# Patient Record
Sex: Female | Born: 1985 | Hispanic: No | Marital: Married | Smoking: Never smoker
Health system: Southern US, Community
[De-identification: ages and names within clinical notes are randomized; demographics above are authoritative.]

## PROBLEM LIST (undated history)

## (undated) ENCOUNTER — Inpatient Hospital Stay (HOSPITAL_COMMUNITY): Payer: Self-pay

## (undated) DIAGNOSIS — D649 Anemia, unspecified: Secondary | ICD-10-CM

## (undated) DIAGNOSIS — O219 Vomiting of pregnancy, unspecified: Secondary | ICD-10-CM

## (undated) HISTORY — PX: BURN TREATMENT: SHX158

---
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
## 2014-10-21 NOTE — L&D Delivery Note (Signed)
Delivery Note At 4:45 PM a viable and healthy female was delivered via Vaginal, Spontaneous Delivery (Presentation: Right Occiput Anterior).  APGAR: 8, 9; weight -pending   Placenta status: Intact, Spontaneous Pathology.  Cord: 3 vessels with the following complications: None.  Cord pH:n/a  Anesthesia: Epidural  Episiotomy: None Lacerations: 2nd degree Suture Repair: 3.0 vicryl rapide Est. Blood Loss (mL):  400 cc  Uterus explored manually due to large clot after initial 3rd stage bleeding, a piece of membrane removed.   Mom to postpartum.  Baby to Couplet care / Skin to Skin.  Maternal low grade fever pre-delivery 100.2, placenta, uterus warm, pt appears flushed, baby was warm at delivery, Plan Cefoxitin 2 gm one dose.   Aris Even R 08/12/2015, 5:28 PM

## 2015-01-11 ENCOUNTER — Encounter (HOSPITAL_COMMUNITY): Payer: Self-pay | Admitting: *Deleted

## 2015-01-11 ENCOUNTER — Inpatient Hospital Stay (HOSPITAL_COMMUNITY)
Admission: AD | Admit: 2015-01-11 | Discharge: 2015-01-11 | Disposition: A | Discharge status: Home / Self Care | Payer: 59 | Source: Ambulatory Visit | Attending: Obstetrics & Gynecology | Admitting: Obstetrics & Gynecology

## 2015-01-11 DIAGNOSIS — O99611 Diseases of the digestive system complicating pregnancy, first trimester: Secondary | ICD-10-CM | POA: Diagnosis not present

## 2015-01-11 DIAGNOSIS — O211 Hyperemesis gravidarum with metabolic disturbance: Secondary | ICD-10-CM | POA: Diagnosis not present

## 2015-01-11 DIAGNOSIS — E860 Dehydration: Secondary | ICD-10-CM

## 2015-01-11 DIAGNOSIS — Z3A01 Less than 8 weeks gestation of pregnancy: Secondary | ICD-10-CM | POA: Diagnosis not present

## 2015-01-11 DIAGNOSIS — R112 Nausea with vomiting, unspecified: Secondary | ICD-10-CM | POA: Diagnosis present

## 2015-01-11 DIAGNOSIS — K219 Gastro-esophageal reflux disease without esophagitis: Secondary | ICD-10-CM | POA: Diagnosis not present

## 2015-01-11 DIAGNOSIS — O210 Mild hyperemesis gravidarum: Secondary | ICD-10-CM | POA: Diagnosis present

## 2015-01-11 LAB — URINALYSIS, ROUTINE W REFLEX MICROSCOPIC
GLUCOSE, UA: NEGATIVE mg/dL
Ketones, ur: >80 mg/dL — AB
Leukocytes, UA: NEGATIVE
Nitrite: NEGATIVE
PH: 6 (ref 5.0–8.0)
Protein, ur: NEGATIVE mg/dL
Urobilinogen, UA: 0.2 mg/dL (ref 0.0–1.0)

## 2015-01-11 LAB — URINE MICROSCOPIC-ADD ON

## 2015-01-11 MED ORDER — ONDANSETRON HCL 4 MG/2ML IJ SOLN
4.0000 mg | Freq: Once | INTRAMUSCULAR | Status: AC
  Administered 2015-01-11: 4 mg via INTRAVENOUS
  Filled 2015-01-11: qty 2

## 2015-01-11 MED ORDER — PROMETHAZINE HCL 25 MG/ML IJ SOLN: 12.5000 mg | Freq: Four times a day (QID) | INTRAMUSCULAR | Status: DC | PRN

## 2015-01-11 MED ORDER — FAMOTIDINE IN NACL 20-0.9 MG/50ML-% IV SOLN
20.0000 mg | Freq: Once | INTRAVENOUS | Status: AC
  Administered 2015-01-11: 20 mg via INTRAVENOUS
  Filled 2015-01-11: qty 50

## 2015-01-11 MED ORDER — DEXTROSE 5 % IN LACTATED RINGERS IV BOLUS
2000.0000 mL | Freq: Once | INTRAVENOUS | Status: AC
  Administered 2015-01-11: 2000 mL via INTRAVENOUS

## 2015-01-11 NOTE — MAU Provider Note (Signed)
  History     CSN: [id number]  Arrival date and time: 01/11/15 1452   First Provider Initiated Contact with Patient 01/11/15 2024      Chief Complaint  Patient presents with  . Nausea  . Emesis   HPI Comments: G2P0010 @[redacted]w[redacted]d  sent from office of HEG. Reports multiple episodes of vomiting yesterday. Took Zofran ODT this am with some relief, no vomiting today but reports nausea, poor appetite, and burning in throat. Tolerating small sips of fluids, hasn't tried solids. CMP in office today WNL, UA with lrg ketones.     Past Medical History: unremarkable Past Surgical History: unremarkable  History  Substance Use Topics  . Smoking status: Not on file  . Smokeless tobacco: Not on file  . Alcohol Use: Not on file    Allergies: No Known Allergies  Prescriptions prior to admission  Medication Sig Dispense Refill Last Dose  . Doxylamine-Pyridoxine 10-10 MG TBEC Take 2 tablets by mouth at bedtime.   Past Week at Unknown time  . metoCLOPramide (REGLAN) 10 MG tablet Take 10 mg by mouth daily as needed for nausea.   Past Week at Unknown time  . ondansetron (ZOFRAN-ODT) 8 MG disintegrating tablet Take 8 mg by mouth every 8 (eight) hours as needed for nausea or vomiting.   01/11/2015 at Unknown time  . Prenatal Vit-Fe Fumarate-FA (PRENATAL MULTIVITAMIN) TABS tablet Take 1 tablet by mouth daily at 12 noon.   Past Week at Unknown time    Review of Systems  Constitutional: Negative.   HENT: Negative.   Eyes: Negative.   Respiratory: Negative.   Cardiovascular: Negative.   Gastrointestinal: Positive for heartburn, nausea and constipation.  Genitourinary: Negative.   Musculoskeletal: Negative.   Skin: Negative.   Neurological: Negative.   Endo/Heme/Allergies: Negative.   Psychiatric/Behavioral: Negative.    Physical Exam   Blood pressure 103/61, pulse 75, temperature 98.4 F (36.9 C), temperature source Oral, resp. rate 16, height 5\' 3"  (1.6 m), weight 53.978 kg (119 lb).  Physical  Exam  Constitutional: She is oriented to person, place, and time. She appears well-developed and well-nourished.  HENT:  Head: Normocephalic and atraumatic.  Eyes: Pupils are equal, round, and reactive to light.  Neck: Normal range of motion. Neck supple.  Cardiovascular: Normal rate and regular rhythm.   Respiratory: Effort normal and breath sounds normal.  GI: Soft. Bowel sounds are normal. There is no tenderness.  Genitourinary:  deferred  Musculoskeletal: Normal range of motion.  Neurological: She is alert and oriented to person, place, and time.  Skin: Skin is warm and dry.  Psychiatric: She has a normal mood and affect.    MAU Course  Procedures  D5LR IVF bolus x2 L Zofran IV Pepcid IV Po challenge-tolerated crackers and gingerale No episodes of vomiting since arrival Reluctant to trying po challenge  Assessment and Plan  [redacted] weeks gestation HEG Dehydration GERD  Discharge home Zofran 4mg  ODT q8 hrs prn (has Rx) Diclegis-full regimen (has Rx) Zantac 75 mg po bid HEG diet Maintain hydration-need to take sips q15 min while awake Follow-up in office in 1 week  Dajour Pierpoint, N 01/11/2015, 8:32 PM

## 2015-01-11 NOTE — Progress Notes (Signed)
Attempted IV X 2 unsuccessful. Blood return noted, would not thread. CRNA requested.

## 2015-01-11 NOTE — Discharge Instructions (Signed)
Eating Plan for Hyperemesis Gravidarum  Severe cases of hyperemesis gravidarum can lead to dehydration and malnutrition. The hyperemesis eating plan is one way to lessen the symptoms of nausea and vomiting. It is often used with prescribed medicines to control your symptoms.   WHAT CAN I DO TO RELIEVE MY SYMPTOMS?  Listen to your body. Everyone is different and has different preferences. Find what works best for you. Some of the following things may help:  · Eat and drink slowly.  · Eat 5-6 small meals daily instead of 3 large meals.    · Eat crackers before you get out of bed in the morning.    · Starchy foods are usually well tolerated (such as cereal, toast, bread, potatoes, pasta, rice, and pretzels).    · Ginger may help with nausea. Add ¼ tsp ground ginger to hot tea or choose ginger tea.    · Try drinking 100% fruit juice or an electrolyte drink.  · Continue to take your prenatal vitamins as directed by your health care provider. If you are having trouble taking your prenatal vitamins, talk with your health care provider about different options.  · Include at least 1 serving of protein with your meals and snacks (such as meats or poultry, beans, nuts, eggs, or yogurt). Try eating a protein-rich snack before bed (such as cheese and crackers or a half turkey or peanut butter sandwich).  WHAT THINGS SHOULD I AVOID TO REDUCE MY SYMPTOMS?  The following things may help reduce your symptoms:  · Avoid foods with strong smells. Try eating meals in well-ventilated areas that are free of odors.  · Avoid drinking water or other beverages with meals. Try not to drink anything less than 30 minutes before and after meals.  · Avoid drinking more than 1 cup of fluid at a time.  · Avoid fried or high-fat foods, such as butter and cream sauces.  · Avoid spicy foods.  · Avoid skipping meals the best you can. Nausea can be more intense on an empty stomach. If you cannot tolerate food at that time, do not force it. Try sucking on  ice chips or other frozen items and make up the calories later.  · Avoid lying down within 2 hours after eating.  Document Released: 08/04/2007 Document Revised: 10/12/2013 Document Reviewed: 08/11/2013  [employer]® Patient Information ©2015 [employer]. This information is not intended to replace advice given to you by your health care provider. Make sure you discuss any questions you have with your health care provider.

## 2015-01-11 NOTE — Progress Notes (Signed)
Fabian NovemberM. Bhambri, [name] given update on delay of IV start, patient has not vomited since arrival.

## 2015-01-11 NOTE — MAU Note (Signed)
C/o N &V  For past week; sent from OB to get fluids;

## 2015-02-10 LAB — OB RESULTS CONSOLE HIV ANTIBODY (ROUTINE TESTING): HIV: NONREACTIVE

## 2015-02-10 LAB — OB RESULTS CONSOLE RPR: RPR: NONREACTIVE

## 2015-02-10 LAB — OB RESULTS CONSOLE RUBELLA ANTIBODY, IGM: RUBELLA: IMMUNE

## 2015-02-10 LAB — OB RESULTS CONSOLE HEPATITIS B SURFACE ANTIGEN: HEP B S AG: NEGATIVE

## 2015-02-17 LAB — OB RESULTS CONSOLE GC/CHLAMYDIA
Chlamydia: NEGATIVE
Gonorrhea: NEGATIVE

## 2015-08-04 LAB — OB RESULTS CONSOLE GBS: GBS: NEGATIVE

## 2015-08-12 ENCOUNTER — Inpatient Hospital Stay (HOSPITAL_COMMUNITY)
Admission: AD | Admit: 2015-08-12 | Discharge: 2015-08-14 | DRG: 775 | Disposition: A | Discharge status: Home / Self Care | Payer: 59 | Source: Ambulatory Visit | Attending: Obstetrics & Gynecology | Admitting: Obstetrics & Gynecology

## 2015-08-12 ENCOUNTER — Inpatient Hospital Stay (HOSPITAL_COMMUNITY): Payer: 59 | Admitting: Anesthesiology

## 2015-08-12 ENCOUNTER — Inpatient Hospital Stay (HOSPITAL_COMMUNITY): Payer: 59

## 2015-08-12 ENCOUNTER — Encounter (HOSPITAL_COMMUNITY): Payer: Self-pay

## 2015-08-12 DIAGNOSIS — Z3A37 37 weeks gestation of pregnancy: Secondary | ICD-10-CM

## 2015-08-12 DIAGNOSIS — O99019 Anemia complicating pregnancy, unspecified trimester: Secondary | ICD-10-CM

## 2015-08-12 DIAGNOSIS — Z8249 Family history of ischemic heart disease and other diseases of the circulatory system: Secondary | ICD-10-CM

## 2015-08-12 DIAGNOSIS — D62 Acute posthemorrhagic anemia: Secondary | ICD-10-CM | POA: Diagnosis not present

## 2015-08-12 DIAGNOSIS — Z833 Family history of diabetes mellitus: Secondary | ICD-10-CM

## 2015-08-12 DIAGNOSIS — O26893 Other specified pregnancy related conditions, third trimester: Secondary | ICD-10-CM | POA: Diagnosis present

## 2015-08-12 DIAGNOSIS — O9081 Anemia of the puerperium: Secondary | ICD-10-CM | POA: Diagnosis not present

## 2015-08-12 DIAGNOSIS — O9962 Diseases of the digestive system complicating childbirth: Secondary | ICD-10-CM | POA: Diagnosis present

## 2015-08-12 DIAGNOSIS — K219 Gastro-esophageal reflux disease without esophagitis: Secondary | ICD-10-CM | POA: Diagnosis present

## 2015-08-12 DIAGNOSIS — Z3689 Encounter for other specified antenatal screening: Secondary | ICD-10-CM

## 2015-08-12 DIAGNOSIS — O411290 Chorioamnionitis, unspecified trimester, not applicable or unspecified: Secondary | ICD-10-CM | POA: Diagnosis present

## 2015-08-12 DIAGNOSIS — D509 Iron deficiency anemia, unspecified: Secondary | ICD-10-CM | POA: Diagnosis present

## 2015-08-12 HISTORY — DX: Anemia, unspecified: D64.9

## 2015-08-12 LAB — CBC
HCT: 34.5 % — ABNORMAL LOW (ref 36.0–46.0)
Hemoglobin: 11.5 g/dL — ABNORMAL LOW (ref 12.0–15.0)
MCH: 29.9 pg (ref 26.0–34.0)
MCHC: 33.3 g/dL (ref 30.0–36.0)
MCV: 89.6 fL (ref 78.0–100.0)
PLATELETS: 221 10*3/uL (ref 150–400)
RBC: 3.85 MIL/uL — AB (ref 3.87–5.11)
RDW: 21 % — ABNORMAL HIGH (ref 11.5–15.5)
WBC: 11 10*3/uL — ABNORMAL HIGH (ref 4.0–10.5)

## 2015-08-12 LAB — POCT FERN TEST: POCT Fern Test: POSITIVE

## 2015-08-12 LAB — RPR: RPR: NONREACTIVE

## 2015-08-12 LAB — ABO/RH: ABO/RH(D): O POS

## 2015-08-12 LAB — TYPE AND SCREEN
ABO/RH(D): O POS
Antibody Screen: NEGATIVE

## 2015-08-12 IMAGING — US US MFM OB LIMITED
1 series · 15 of 26 positions shown · non-contrast
Comparison: none

[Series 1: us mfm ob limited · 26 acquisitions, 15 frames shown]
[im 1/26]
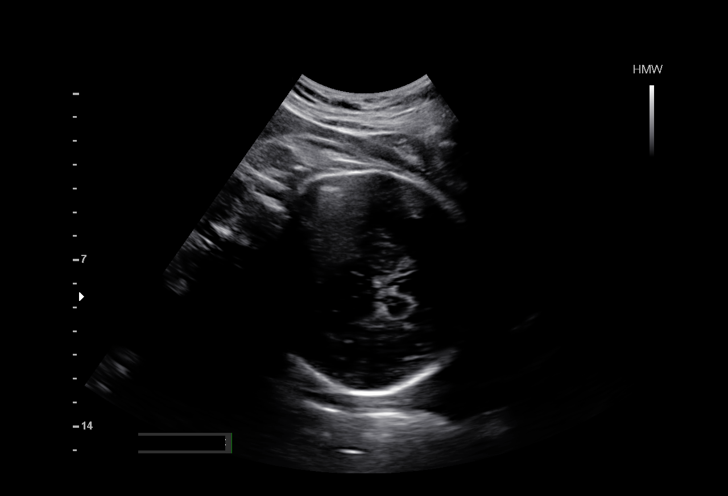
[im 3/26]
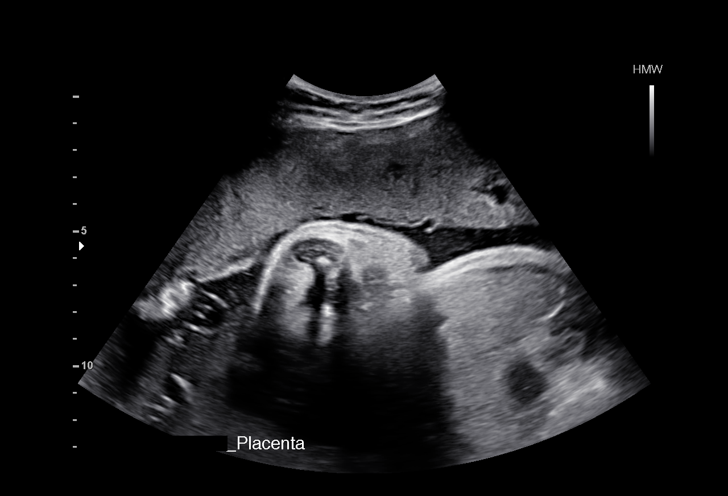
[im 5/26]
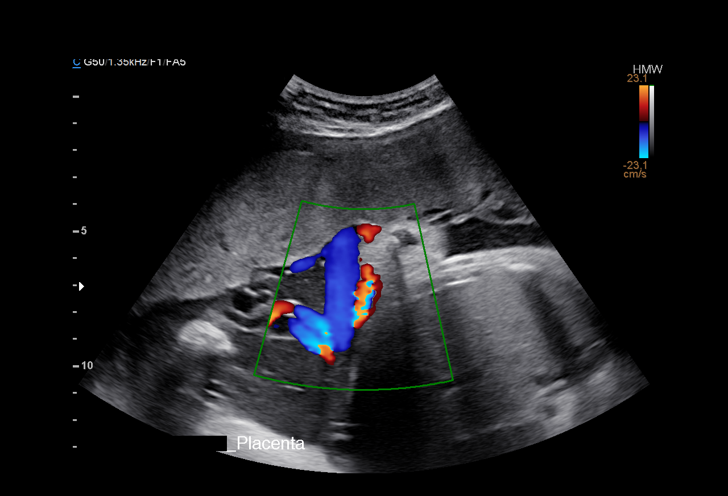
[im 7/26]
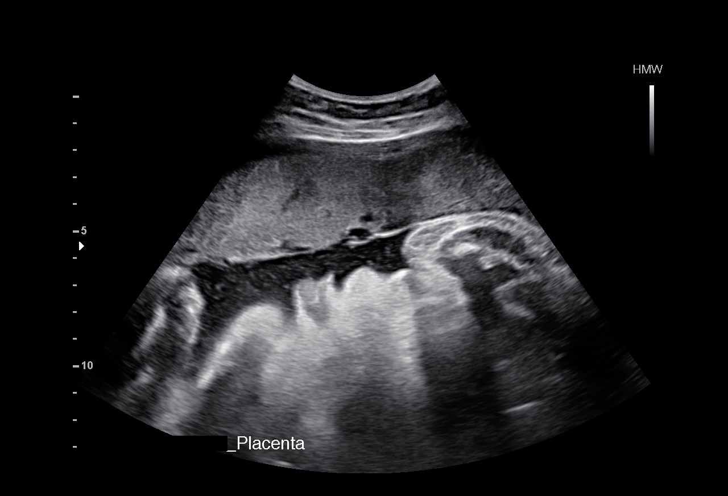
[im 8/26]
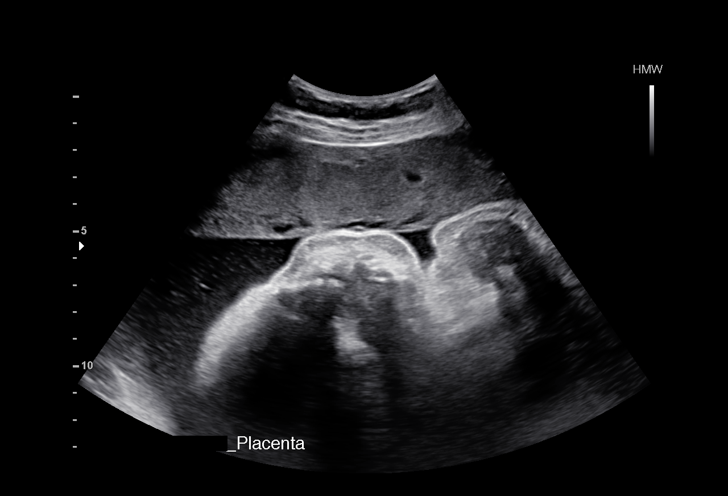
[im 10/26]
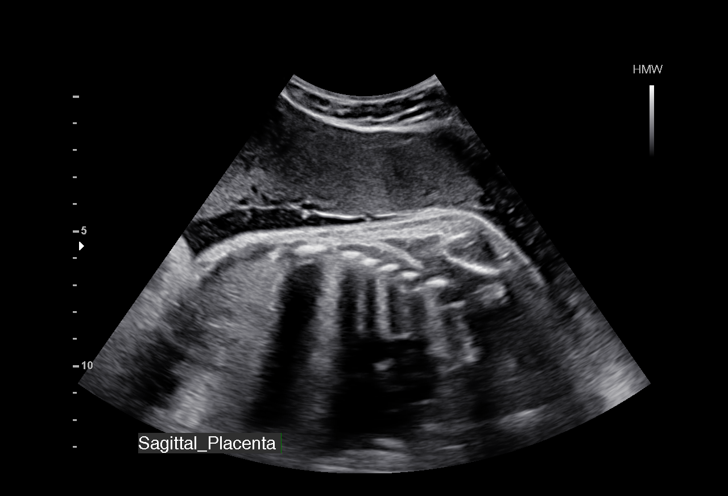
[im 12/26]
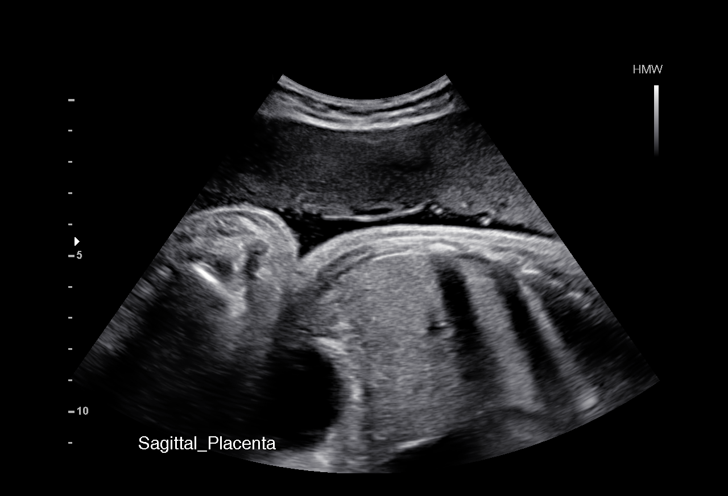
[im 14/26]
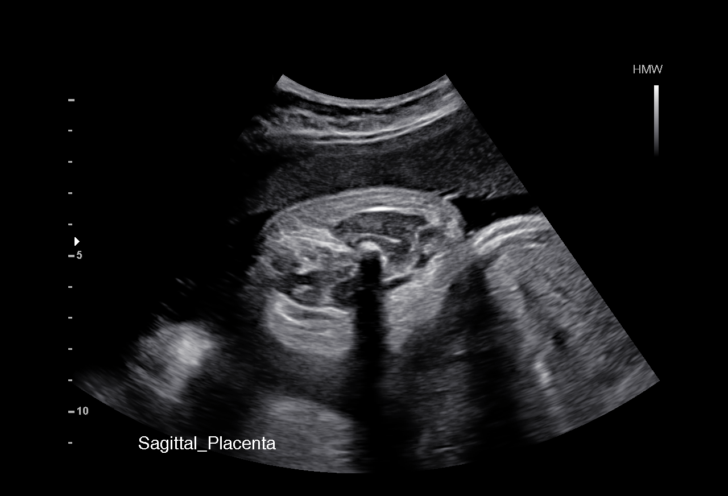
[im 15/26]
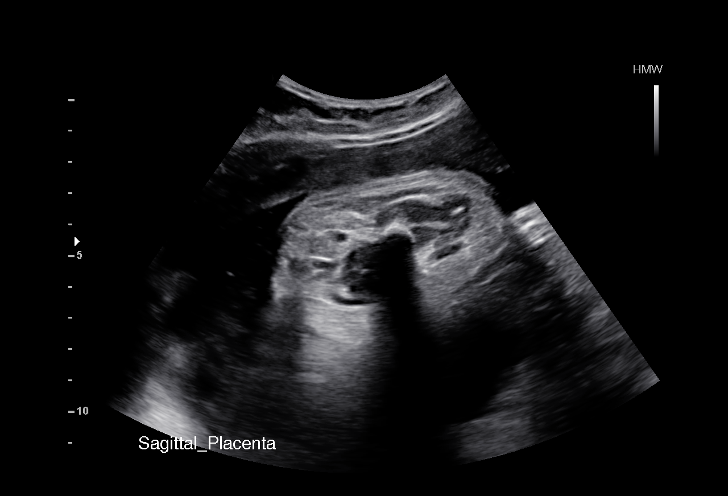
[im 17/26]
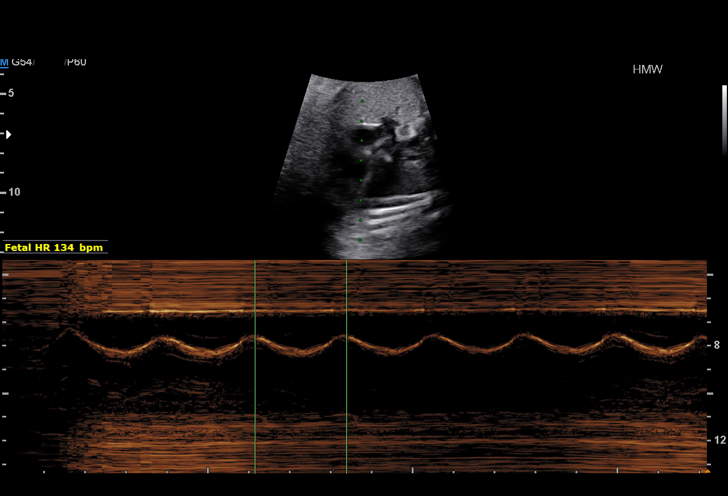
[im 19/26]
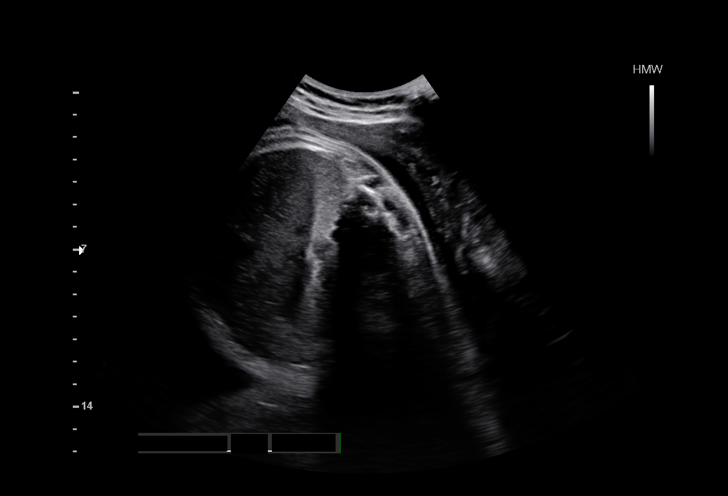
[im 20/26]
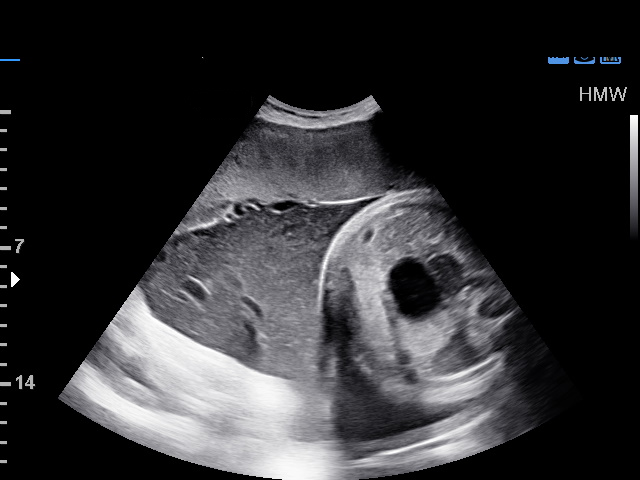
[im 22/26]
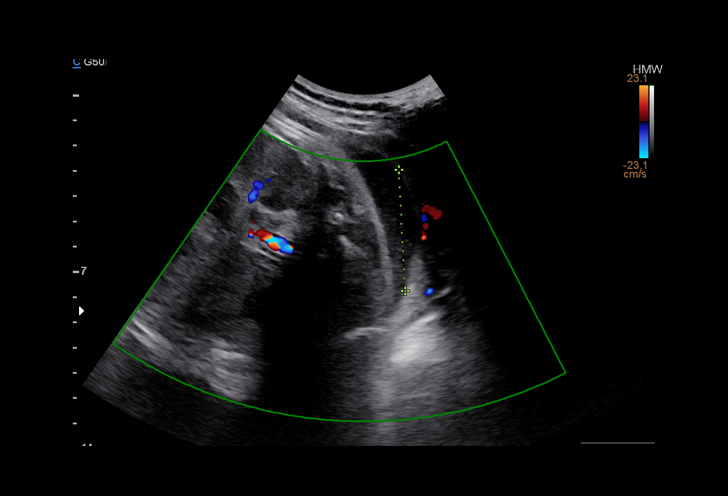
[im 24/26]
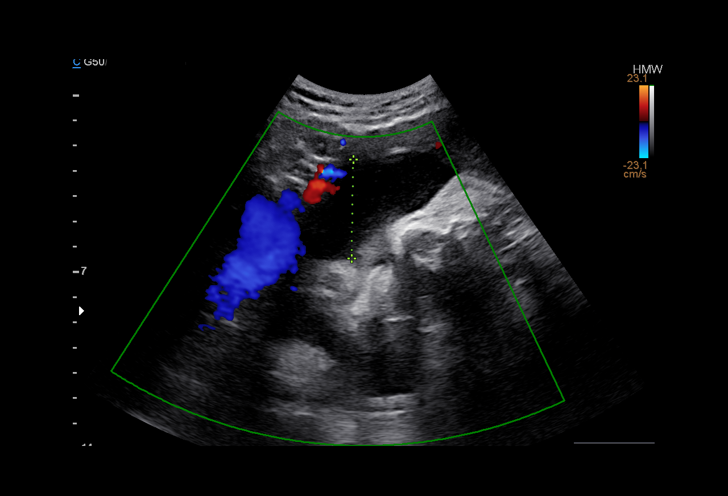
[im 26/26]
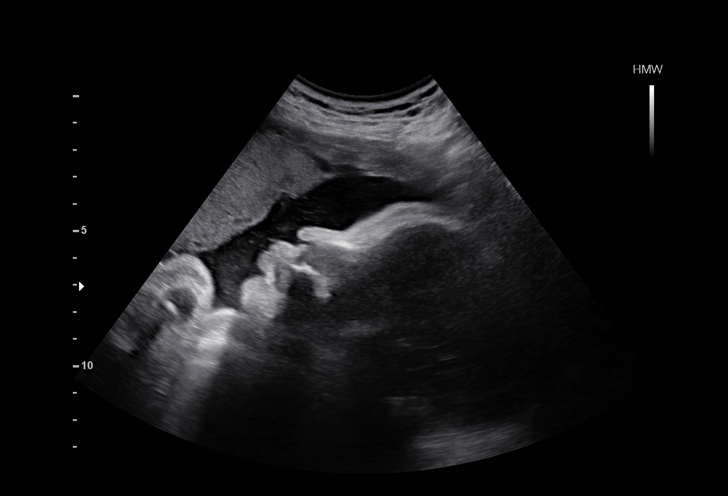

[15 of 26 positions shown; findings below may reference images not displayed]

OBSTETRICS REPORT
(Signed Final [DATE] [DATE])

Service(s) Provided

US MFM OB LIMITED                                     76815.01
Indications

37 weeks gestation of pregnancy
Premature rupture of membranes - leaking fluid        [DT]
Determine fetal presentation using ultrasound         Z36

Fetal Evaluation

Num Of Fetuses:    1
Fetal Heart Rate:  134                          bpm
Cardiac Activity:  Observed
Presentation:      Cephalic
Placenta:          Anterior, above cervical os
P. Cord            Visualized, central
Insertion:

Amniotic Fluid
AFI FV:      Subjectively within normal limits
AFI Sum:     13.12   cm       48  %Tile     Larg Pckt:     4.8  cm
RUQ:   4.41    cm   RLQ:    3.91   cm    LUQ:   4.8     cm   LLQ:    0      cm
Gestational Age

LMP:           37w 4d        Date:  [DATE]                 EDD:   [DATE]
Best:          37w 4d     Det. By:  LMP  ([DATE])          EDD:   [DATE]
Cervix Uterus Adnexa

Cervix:       Not visualized (advanced GA >[DT])
Uterus:       No abnormality visualized.
Cul De Sac:   No free fluid seen.
Left Ovary:    Not visualized.
Right Ovary:   Not visualized.

Adnexa:     No abnormality visualized.
Impression

Single IUP at 37w 4d
Limited ultrasound performed to determine fetal presentation
Cephalic presentation
Anterior placenta witout previa
Normal amniotic fluid volume (AFI 13 cm)
Recommendations

Follow up ultrasounds as clinically indicated

## 2015-08-12 MED ORDER — ZOLPIDEM TARTRATE 5 MG PO TABS: 5.0000 mg | ORAL_TABLET | Freq: Every evening | ORAL | Status: DC | PRN

## 2015-08-12 MED ORDER — LIDOCAINE HCL (PF) 1 % IJ SOLN
INTRAMUSCULAR | Status: DC | PRN
  Administered 2015-08-12 (×2): 5 mL

## 2015-08-12 MED ORDER — OXYTOCIN BOLUS FROM INFUSION
500.0000 mL | INTRAVENOUS | Status: DC
  Administered 2015-08-12: 500 mL via INTRAVENOUS

## 2015-08-12 MED ORDER — BENZOCAINE-MENTHOL 20-0.5 % EX AERO
1.0000 "application " | INHALATION_SPRAY | CUTANEOUS | Status: DC | PRN
  Administered 2015-08-13 – 2015-08-14 (×2): 1 via TOPICAL
  Filled 2015-08-12 (×2): qty 56

## 2015-08-12 MED ORDER — OXYTOCIN 40 UNITS IN LACTATED RINGERS INFUSION - SIMPLE MED
1.0000 m[IU]/min | INTRAVENOUS | Status: DC
  Administered 2015-08-12: 1 m[IU]/min via INTRAVENOUS
  Filled 2015-08-12: qty 1000

## 2015-08-12 MED ORDER — FENTANYL 2.5 MCG/ML BUPIVACAINE 1/10 % EPIDURAL INFUSION (WH - ANES)
14.0000 mL/h | INTRAMUSCULAR | Status: DC | PRN
  Administered 2015-08-12 (×2): 14 mL/h via EPIDURAL
  Filled 2015-08-12 (×2): qty 125

## 2015-08-12 MED ORDER — FLEET ENEMA 7-19 GM/118ML RE ENEM: 1.0000 | ENEMA | RECTAL | Status: DC | PRN

## 2015-08-12 MED ORDER — IBUPROFEN 600 MG PO TABS
600.0000 mg | ORAL_TABLET | Freq: Four times a day (QID) | ORAL | Status: DC
  Administered 2015-08-13 – 2015-08-14 (×7): 600 mg via ORAL
  Filled 2015-08-12 (×7): qty 1

## 2015-08-12 MED ORDER — LACTATED RINGERS IV SOLN
INTRAVENOUS | Status: DC
  Administered 2015-08-12 (×2): via INTRAVENOUS

## 2015-08-12 MED ORDER — OXYTOCIN 40 UNITS IN LACTATED RINGERS INFUSION - SIMPLE MED
62.5000 mL/h | INTRAVENOUS | Status: DC
  Administered 2015-08-12: 62.5 mL/h via INTRAVENOUS

## 2015-08-12 MED ORDER — TERBUTALINE SULFATE 1 MG/ML IJ SOLN
0.2500 mg | Freq: Once | INTRAMUSCULAR | Status: DC | PRN
  Filled 2015-08-12: qty 1

## 2015-08-12 MED ORDER — LACTATED RINGERS IV SOLN
500.0000 mL | INTRAVENOUS | Status: DC | PRN
  Administered 2015-08-12: 250 mL via INTRAVENOUS
  Administered 2015-08-12: 500 mL via INTRAVENOUS
  Administered 2015-08-12 (×2): 250 mL via INTRAVENOUS
  Administered 2015-08-12: 500 mL via INTRAVENOUS

## 2015-08-12 MED ORDER — DIPHENHYDRAMINE HCL 25 MG PO CAPS: 25.0000 mg | ORAL_CAPSULE | Freq: Four times a day (QID) | ORAL | Status: DC | PRN

## 2015-08-12 MED ORDER — DIBUCAINE 1 % RE OINT
1.0000 "application " | TOPICAL_OINTMENT | RECTAL | Status: DC | PRN
  Administered 2015-08-13: 1 via RECTAL
  Filled 2015-08-12: qty 28

## 2015-08-12 MED ORDER — ONDANSETRON HCL 4 MG/2ML IJ SOLN: 4.0000 mg | INTRAMUSCULAR | Status: DC | PRN

## 2015-08-12 MED ORDER — WITCH HAZEL-GLYCERIN EX PADS
1.0000 "application " | MEDICATED_PAD | CUTANEOUS | Status: DC | PRN
  Administered 2015-08-13: 1 via TOPICAL

## 2015-08-12 MED ORDER — ACETAMINOPHEN 325 MG PO TABS: 650.0000 mg | ORAL_TABLET | ORAL | Status: DC | PRN

## 2015-08-12 MED ORDER — ACETAMINOPHEN 325 MG PO TABS
650.0000 mg | ORAL_TABLET | ORAL | Status: DC | PRN
  Administered 2015-08-12: 650 mg via ORAL
  Filled 2015-08-12: qty 2

## 2015-08-12 MED ORDER — DEXTROSE 5 % IV SOLN
2.0000 g | Freq: Once | INTRAVENOUS | Status: AC
  Administered 2015-08-12: 2 g via INTRAVENOUS
  Filled 2015-08-12: qty 2

## 2015-08-12 MED ORDER — ONDANSETRON HCL 4 MG PO TABS
4.0000 mg | ORAL_TABLET | ORAL | Status: DC | PRN
  Administered 2015-08-12: 4 mg via ORAL
  Filled 2015-08-12: qty 1

## 2015-08-12 MED ORDER — DIPHENHYDRAMINE HCL 50 MG/ML IJ SOLN: 12.5000 mg | INTRAMUSCULAR | Status: DC | PRN

## 2015-08-12 MED ORDER — CITRIC ACID-SODIUM CITRATE 334-500 MG/5ML PO SOLN: 30.0000 mL | ORAL | Status: DC | PRN

## 2015-08-12 MED ORDER — SIMETHICONE 80 MG PO CHEW
80.0000 mg | CHEWABLE_TABLET | ORAL | Status: DC | PRN
Start: 2015-08-12 — End: 2015-08-14

## 2015-08-12 MED ORDER — OXYCODONE-ACETAMINOPHEN 5-325 MG PO TABS
1.0000 | ORAL_TABLET | ORAL | Status: DC | PRN
  Administered 2015-08-13 – 2015-08-14 (×3): 1 via ORAL
  Filled 2015-08-12 (×3): qty 1

## 2015-08-12 MED ORDER — PHENYLEPHRINE 40 MCG/ML (10ML) SYRINGE FOR IV PUSH (FOR BLOOD PRESSURE SUPPORT)
80.0000 ug | PREFILLED_SYRINGE | INTRAVENOUS | Status: DC | PRN
  Administered 2015-08-12 (×2): 80 ug via INTRAVENOUS
  Filled 2015-08-12: qty 20
  Filled 2015-08-12: qty 2

## 2015-08-12 MED ORDER — TETANUS-DIPHTH-ACELL PERTUSSIS 5-2.5-18.5 LF-MCG/0.5 IM SUSP: 0.5000 mL | Freq: Once | INTRAMUSCULAR | Status: DC

## 2015-08-12 MED ORDER — EPHEDRINE 5 MG/ML INJ
10.0000 mg | INTRAVENOUS | Status: DC | PRN
  Filled 2015-08-12: qty 2

## 2015-08-12 MED ORDER — PRENATAL MULTIVITAMIN CH
1.0000 | ORAL_TABLET | Freq: Every day | ORAL | Status: DC
  Administered 2015-08-13: 1 via ORAL
  Filled 2015-08-12: qty 1

## 2015-08-12 MED ORDER — ONDANSETRON HCL 4 MG/2ML IJ SOLN: 4.0000 mg | Freq: Four times a day (QID) | INTRAMUSCULAR | Status: DC | PRN

## 2015-08-12 MED ORDER — LIDOCAINE HCL (PF) 1 % IJ SOLN
30.0000 mL | INTRAMUSCULAR | Status: AC | PRN
  Administered 2015-08-12: 30 mL via SUBCUTANEOUS
  Filled 2015-08-12: qty 30

## 2015-08-12 MED ORDER — OXYCODONE-ACETAMINOPHEN 5-325 MG PO TABS: 2.0000 | ORAL_TABLET | ORAL | Status: DC | PRN

## 2015-08-12 MED ORDER — SENNOSIDES-DOCUSATE SODIUM 8.6-50 MG PO TABS
2.0000 | ORAL_TABLET | ORAL | Status: DC
  Administered 2015-08-13 – 2015-08-14 (×2): 2 via ORAL
  Filled 2015-08-12 (×2): qty 2

## 2015-08-12 MED ORDER — OXYCODONE-ACETAMINOPHEN 5-325 MG PO TABS: 1.0000 | ORAL_TABLET | ORAL | Status: DC | PRN

## 2015-08-12 MED ORDER — LANOLIN HYDROUS EX OINT: TOPICAL_OINTMENT | CUTANEOUS | Status: DC | PRN
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
## 2015-08-12 NOTE — Anesthesia Procedure Notes (Signed)
Epidural Patient location during procedure: OB  Staffing Anesthesiologist: Phillips GroutARIGNAN, Carmino Ocain Performed by: anesthesiologist   Preanesthetic Checklist Completed: patient identified, site marked, surgical consent, pre-op evaluation, timeout performed, IV checked, risks and benefits discussed and monitors and equipment checked  Epidural Patient position: sitting Prep: DuraPrep Patient monitoring: heart rate, continuous pulse ox and blood pressure Approach: right paramedian Location: L4-L5 Injection technique: LOR saline  Needle:  Needle type: Tuohy  Needle gauge: 17 G Needle length: 9 cm and 9 Needle insertion depth: 6 cm Catheter type: closed end flexible Catheter size: 20 Guage Catheter at skin depth: 11 cm Test dose: negative  Assessment Events: blood not aspirated, injection not painful, no injection resistance, negative IV test and no paresthesia  Additional Notes Patient identified. Risks/Benefits/Options discussed with patient including but not limited to bleeding, infection, nerve damage, paralysis, failed block, incomplete pain control, headache, blood pressure changes, nausea, vomiting, reactions to medication both or allergic, itching and postpartum back pain. Confirmed with bedside nurse the patient's most recent platelet count. Confirmed with patient that they are not currently taking any anticoagulation, have any bleeding history or any family history of bleeding disorders. Patient expressed understanding and wished to proceed. All questions were answered. Sterile technique was used throughout the entire procedure. Please see nursing notes for vital signs. Test dose was given through epidural needle and negative prior to continuing to dose epidural or start infusion. Warning signs of high block given to the patient including shortness of breath, tingling/numbness in hands, complete motor block, or any concerning symptoms with instructions to call for help. Patient was given  instructions on fall risk and not to get out of bed. All questions and concerns addressed with instructions to call with any issues.

## 2015-08-12 NOTE — MAU Note (Signed)
Contractions for an hour.Water broke about 0115. Some bloody show.

## 2015-08-12 NOTE — Progress Notes (Signed)
Patient ID: Alexis Butler, female   DOB: 10/29/1985, 29 y.o.   MRN: [id number] Subjective: Doing well, pain controlled with Epidural   Objective: BP 103/73 mmHg  Pulse 97  Temp(Src) 98.2 F (36.8 C) (Oral)  Resp 18  Ht 5\' 3"  (1.6 m)  Wt 143 lb 6.4 oz (65.046 kg)  BMI 25.41 kg/m2  SpO2 99%  FHT:  FHR: 140 bpm, variability: moderate,  accelerations:  Present,  decelerations:  Absent UC:   regular, every 3 minutes SVE:   Dilation: 9 Effacement (%): 100 Station: 0 Exam by:: katherine g jones Rn    Assessment / Plan: Augmentation of labor, progressing well  Fetal Wellbeing:  Category I Pain Control:  Epidural  Anticipated MOD:  NSVD possible  Alexis Butler 08/12/2015, 1:47 PM

## 2015-08-12 NOTE — Lactation Note (Signed)
This note was copied from the chart of Alexis Lonnette Keisler. Lactation Consultation Note  Patient Name: Alexis Dorothea GlassmanSireesha Butler Today's Date: 08/12/2015 Reason for consult: Initial assessment Baby at 4 hr of life and has bf well. Mom is having trouble going to the bathroom and feeling very weak. Given lactation handouts and briefly answered her questions about positions. She will need more education when she feels better.   Maternal Data    Feeding Feeding Type: Breast Fed Length of feed: 10 min  LATCH Score/Interventions Latch: Repeated attempts needed to sustain latch, nipple held in mouth throughout feeding, stimulation needed to elicit sucking reflex. Intervention(s): Adjust position;Assist with latch;Breast massage;Breast compression  Audible Swallowing: A few with stimulation Intervention(s): Skin to skin;Hand expression  Type of Nipple: Everted at rest and after stimulation  Comfort (Breast/Nipple): Soft / non-tender     Hold (Positioning): Full assist, staff holds infant at breast Intervention(s): Support Pillows;Position options;Skin to skin;Breastfeeding basics reviewed  LATCH Score: 6  Lactation Tools Discussed/Used     Consult Status Consult Status: Follow-up Date: 08/13/15 Follow-up type: In-patient    Alexis Butler 08/12/2015, 8:53 PM

## 2015-08-12 NOTE — Anesthesia Preprocedure Evaluation (Signed)

## 2015-08-12 NOTE — Progress Notes (Signed)
Report given and care relinquished to K. Jones, RN.  

## 2015-08-12 NOTE — Progress Notes (Signed)
Continuous FHR surveillance interrupted, RN to bedside, EFM adjusted.  

## 2015-08-12 NOTE — Progress Notes (Signed)
Pt ambulated safely to BR. EFM disconnected. Cat I Surveillance noted.   

## 2015-08-13 DIAGNOSIS — D62 Acute posthemorrhagic anemia: Secondary | ICD-10-CM | POA: Diagnosis not present

## 2015-08-13 DIAGNOSIS — D509 Iron deficiency anemia, unspecified: Secondary | ICD-10-CM | POA: Diagnosis present

## 2015-08-13 DIAGNOSIS — O99019 Anemia complicating pregnancy, unspecified trimester: Secondary | ICD-10-CM

## 2015-08-13 LAB — CBC
HEMATOCRIT: 26.5 % — AB (ref 36.0–46.0)
HEMOGLOBIN: 8.8 g/dL — AB (ref 12.0–15.0)
MCH: 29.3 pg (ref 26.0–34.0)
MCHC: 33.2 g/dL (ref 30.0–36.0)
MCV: 88.3 fL (ref 78.0–100.0)
Platelets: 190 10*3/uL (ref 150–400)
RBC: 3 MIL/uL — ABNORMAL LOW (ref 3.87–5.11)
RDW: 21.2 % — AB (ref 11.5–15.5)
WBC: 14.7 10*3/uL — AB (ref 4.0–10.5)

## 2015-08-13 MED ORDER — MAGNESIUM OXIDE 400 (241.3 MG) MG PO TABS
200.0000 mg | ORAL_TABLET | Freq: Every day | ORAL | Status: DC
  Administered 2015-08-13 – 2015-08-14 (×2): 200 mg via ORAL
  Filled 2015-08-13 (×3): qty 0.5

## 2015-08-13 MED ORDER — POLYSACCHARIDE IRON COMPLEX 150 MG PO CAPS
150.0000 mg | ORAL_CAPSULE | Freq: Two times a day (BID) | ORAL | Status: DC
  Administered 2015-08-13 – 2015-08-14 (×3): 150 mg via ORAL
  Filled 2015-08-13 (×3): qty 1

## 2015-08-13 NOTE — Lactation Note (Signed)
This note was copied from the chart of Alexis Butler. Lactation Consultation Note  Patient Name: Alexis Butler Today's Date: 08/13/2015 Reason for consult: Initial assessment Baby 21 hours old. Mom reports that baby has been to breast 3 times in 21 hours/since birth. Baby dressed and wrapped tight in crib. Enc lots of STS and attempts at breast. Undressed baby and demonstrated hand expression to mom. No colostrum visible from left breast. Assisted mom to latch baby to left breast in football position, baby latched deeply, suckling rhythmically--off and on--for about 10 minutes with no swallows noted, and baby wanting to suck her tongue. Demonstrated to mom how to let baby suckle a finger, with this LC's gloved finger, in order to elicit a suckle. Baby much more alert and willing to suckle. Assisted mom to hand express right breast with colostrum visible. Assisted mom to hand express right breast with colostrum visible. Mom able to latch baby to right breast, baby latched eagerly, suckling rhythmically with a few swallows noted.   Enc mom to keep baby at breast and continue nursing with baby's cues. Mom given hand pump with instructions and enc to post pump in order to stimulate breast. Enc nursing with cues, and at least every 3 hours. Discussed with mom that if her colostrum is not increasing and baby not appearing satisfied, she can start pumping this evening with a hospital DEBP.  Mom enc to ask for assistance from her nurse to latch in cross-cradle position, as mom wanting assistance. [facility] given [facility], aware of OP/BFSG, community resources, and [facility] assistance after [facility]/C.   Maternal Data Has patient been taught Hand Expression?: Yes Does the patient have breastfeeding experience prior to this delivery?: No  Feeding Feeding Type: Breast Fed Length of feed: 10 min  LATCH Score/Interventions Latch: Repeated attempts needed to sustain latch, nipple held in mouth  throughout feeding, stimulation needed to elicit sucking reflex. Intervention(s): Adjust position;Assist with latch;Breast compression  Audible Swallowing: None Intervention(s): Skin to skin;Hand expression Intervention(s): Skin to skin;Hand expression  Type of Nipple: Everted at rest and after stimulation  Comfort (Breast/Nipple): Soft / non-tender     Hold (Positioning): Assistance needed to correctly position infant at breast and maintain latch.  LATCH Score: 6  Lactation Tools Discussed/Used Tools: Pump Breast pump type: Manual   Consult Status Consult Status: Follow-up Date: 08/14/15 Follow-up type: In-patient    Geralynn OchsWILLIARD, Terriona Horlacher 08/13/2015, 2:39 PM

## 2015-08-13 NOTE — Progress Notes (Signed)
PPD #1- SVD  Subjective:   Reports feeling ok, some cramping Tolerating po/ No nausea or vomiting Bleeding is light Pain controlled with Motrin Up ad lib / ambulatory / voiding without problems Newborn: breastfeeding    Objective:   VS:  VS:  Filed Vitals:   08/12/15 1849 08/12/15 1915 08/12/15 2030 08/13/15 0005  BP: 113/67 97/63 94/63  99/57  Pulse: 105 97 107 93  Temp:  98.3 F (36.8 C) 98.9 F (37.2 C) 97.9 F (36.6 C)  TempSrc:  Oral Oral Oral  Resp:  20 20 20   Height:      Weight:      SpO2:        LABS:  Recent Labs  08/12/15 0355 08/13/15 0530  WBC 11.0* 14.7*  HGB 11.5* 8.8*  PLT 221 190   Blood type: --/--/O POS, O POS (10/22 0355) Rubella: Immune (04/22 0000)   I&O: Intake/Output      10/22 0701 - 10/23 0700 10/23 0701 - 10/24 0700   Urine (mL/kg/hr) 2150 (1.4)    Blood 400 (0.3)    Total Output 2550     Net -2550            Physical Exam: Alert and oriented x3 Abdomen: soft, non-tender, non-distended  Fundus: firm, non-tender, U-3 Perineum: Well approximated, no significant erythema, edema, or drainage; healing well. Lochia: small Extremities: No edema, no calf pain or tenderness   Assessment:  PPD #1 G1P1001/ S/P: spontaneous vaginal, 2nd degree laceration IDA with compounding ABL anemia  Doing well   Plan: Start Fe Continue routine post partum orders Anticipate D/C home tomorrow   Donette LarryBHAMBRI, Antwaine Boomhower, N MSN, [name] 08/13/2015, 8:47 AM

## 2015-08-13 NOTE — Anesthesia Postprocedure Evaluation (Signed)
Anesthesia Post Note  Patient: Alexis Butler  Procedure(s) Performed: * No procedures listed *  Anesthesia type: Epidural  Patient location: Mother/Baby  Post pain: Pain level controlled  Post assessment: Post-op Vital signs reviewed  Last Vitals:  Filed Vitals:   08/13/15 0005  BP: 99/57  Pulse: 93  Temp: 36.6 C  Resp: 20    Post vital signs: Reviewed  Level of consciousness:alert  Complications: No apparent anesthesia complications

## 2015-08-14 MED ORDER — MAGNESIUM OXIDE 400 (241.3 MG) MG PO TABS: 200.0000 mg | ORAL_TABLET | Freq: Every day | ORAL | Status: DC

## 2015-08-14 MED ORDER — IBUPROFEN 600 MG PO TABS: 600.0000 mg | ORAL_TABLET | Freq: Four times a day (QID) | ORAL | Status: DC

## 2015-08-14 MED ORDER — POLYSACCHARIDE IRON COMPLEX 150 MG PO CAPS: 150.0000 mg | ORAL_CAPSULE | Freq: Two times a day (BID) | ORAL | Status: DC

## 2015-08-14 NOTE — Lactation Note (Signed)
This note was copied from the chart of Alexis Zykerria Lienau. Lactation Consultation Note  Patient Name: Alexis Butler HYQMV'HToday's Date: 08/14/2015 Reason for consult: Follow-up assessment Mom called for assist with latch. Assisted Mom with positioning and obtaining good depth. Mom wanted to try cross cradle, football and side lying positons to feel comfortable with latch. Reviewed with Mom how to assess for depth with latch, how to bring bottom lip down. Reviewed good support of breast and visual cues to assess for depth and good positioning. Answered Mom's questions. Reminded Mom of OP services and support group. Reviewed resource sheet for Mom to connect with parenting resources on-line. Mom plans to schedule OP f/u with [facility] at this time.   Maternal Data Has patient been taught Hand Expression?: Yes Does the patient have breastfeeding experience prior to this delivery?: No  Feeding Feeding Type: Breast Fed Length of feed: 15 min  LATCH Score/Interventions Latch: Grasps breast easily, tongue down, lips flanged, rhythmical sucking. Intervention(s): Adjust position;Assist with latch;Breast massage;Breast compression  Audible Swallowing: A few with stimulation  Type of Nipple: Everted at rest and after stimulation  Comfort (Breast/Nipple): Filling, red/small blisters or bruises, mild/mod discomfort  Problem noted: Mild/Moderate discomfort Interventions (Mild/moderate discomfort): Hand massage;Hand expression;Comfort gels  Hold (Positioning): Assistance needed to correctly position infant at breast and maintain latch. Intervention(s): Breastfeeding basics reviewed;Support Pillows;Position options;Skin to skin  LATCH Score: 7  Lactation Tools Discussed/Used Tools: Pump;Comfort gels Breast pump type: Manual WIC Program: No Pump Review: Setup, frequency, and cleaning;Milk Storage Initiated by:: MAI , Reviewed  Date initiated:: 08/14/15   Consult Status Consult  Status: Complete Date: 08/14/15 Follow-up type: In-patient    Alfred LevinsGranger, Rashad Obeid Ann 08/14/2015, 1:37 PM

## 2015-08-14 NOTE — Progress Notes (Signed)
PPD #2 - SVD  Subjective:   Reports feeling ok, some cramping Tolerating po/ No nausea or vomiting Bleeding is light Pain controlled with Motrin Up ad lib / ambulatory / voiding without problems Newborn: breastfeeding    Objective:   VS:  VS:  Filed Vitals:   08/13/15 0005 08/13/15 1824 08/13/15 1849 08/14/15 0600  BP: 99/57  101/60 101/69  Pulse: 93 85  84  Temp: 97.9 F (36.6 C) 98.2 F (36.8 C)  97.4 F (36.3 C)  TempSrc: Oral Oral    Resp: 20 18  18   Height:      Weight:      SpO2:        LABS:   Recent Labs  08/12/15 0355 08/13/15 0530  WBC 11.0* 14.7*  HGB 11.5* 8.8*  PLT 221 190   Blood type: --/--/O POS, O POS (10/22 0355) Rubella: Immune (04/22 0000)   I&O: Intake/Output      10/23 0701 - 10/24 0700 10/24 0701 - 10/25 0700   Urine (mL/kg/hr)     Blood     Total Output       Net              Physical Exam: Alert and oriented x3 Abdomen: soft, non-tender, non-distended  Fundus: firm, non-tender, U-1 Perineum: Well approximated, no significant erythema, edema, or drainage; healing well. Lochia: small Extremities: No edema, no calf pain or tenderness   Assessment:  PPD #2 / G1P1001 / S/P: spontaneous vaginal, 2nd degree laceration IDA with compounding ABL anemia  Doing well   Plan: Continue Niferex 150 mg BID and Magnesium Oxide 200 mg Continue routine post partum orders D/C home today   Alexis Butler, Alexis Butler, M MSN, [name] 08/14/2015, 9:32 AM

## 2015-08-14 NOTE — Discharge Instructions (Signed)
Breast Pumping Tips If you are breastfeeding, there may be times when you cannot feed your baby directly. Returning to work or going on a trip are examples. Pumping allows you to store breast milk and feed it to your baby later.  You may not get much milk when you first start to pump. Your breasts should start to make more after a few days. If you pump at the times you usually feed your baby, you may be able to keep making enough milk to feed your baby without also using formula. The more often you pump, the more milk your body will make. WHEN SHOULD I PUMP?   You can start to pump soon after you have your baby. Ask your doctor what is right for you and your baby.  If you are going back to work, start pumping a few weeks before. This gives you time to learn how to pump and to store a supply of milk.  When you are with your baby, feed your baby when he or she is hungry. Pump after each feeding.  When you are away from your baby for many hours, pump for about 15 minutes every 2-3 hours. Pump both breasts at the same time if you can.  If your baby has a formula feeding, make sure to pump close to the same time.  If you drink any alcohol, wait 2 hours before pumping. HOW DO I GET READY TO PUMP? Your let-down reflex is your body's natural reaction that makes your breast milk flow. It is easier to make your breast milk flow when you are relaxed. Try these things to help you relax:  Smell one of your baby's blankets or an item of clothing.  Look at a picture or video of your baby.  Sit in a quiet, private space.  Massage the breast you plan to pump.  Place soothing warmth on the breast.  Play relaxing music. WHAT ARE SOME BREAST PUMPING TIPS?  Wash your hands before you pump. You do not need to wash your nipples or breasts.  There are three ways to pump. You can:  Use your hand to massage and squeeze your breast.  Use a handheld manual pump.  Use an electric pump.  Make sure the  suction cup on the breast pump is the right size. Place the suction cup directly over the nipple. It can be painful or hurt your nipple if it is the wrong size or placed wrong.  Put a small amount of purified or modified lanolin on your nipple and areola if you are sore.  If you are using an electric pump, change the speed and suction power to be more comfortable.  You may need a different type of pump if pumping hurts or you do not get a lot of milk. Your doctor can help you pick what type of pump to use.  Keep a full water bottle near you always. Drinking lots of fluid helps you make more milk.  You can store your milk to use later. Pumped breast milk can be stored in a sealable, sterile container or plastic bag. Always put the date you pumped it on the container.  Milk can stay out at room temperature for up to 8 hours.  You can store your milk in the refrigerator for up to 8 days.  You can store your milk in the freezer for 3 months. Thaw frozen milk using warm water. Do not put it in the microwave.  Do not smoke.  Ask your doctor for help. WHEN SHOULD I CALL MY DOCTOR?  You have a hard time pumping.  You are worried you do not make enough milk.  You have nipple pain, soreness, or redness.  You want to take birth control pills.   This information is not intended to replace advice given to you by your health care provider. Make sure you discuss any questions you have with your health care provider.   Document Released: 03/25/2008 Document Revised: 10/12/2013 Document Reviewed: 07/30/2013 Elsevier Interactive Patient Education 2016 [employer]. Postpartum Depression and Baby Blues The postpartum period begins right after the birth of a baby. During this time, there is often a great amount of joy and excitement. It is also a time of many changes in the life of the parents. Regardless of how many times a mother gives birth, each child brings new challenges and dynamics to the  family. It is not unusual to have feelings of excitement along with confusing shifts in moods, emotions, and thoughts. All mothers are at risk of developing postpartum depression or the "baby blues." These mood changes can occur right after giving birth, or they may occur many months after giving birth. The baby blues or postpartum depression can be mild or severe. Additionally, postpartum depression can go away rather quickly, or it can be a long-term condition.  CAUSES Raised hormone levels and the rapid drop in those levels are thought to be a main cause of postpartum depression and the baby blues. A number of hormones change during and after pregnancy. Estrogen and progesterone usually decrease right after the delivery of your baby. The levels of thyroid hormone and various cortisol steroids also rapidly drop. Other factors that play a role in these mood changes include major life events and genetics.  RISK FACTORS If you have any of the following risks for the baby blues or postpartum depression, know what symptoms to watch out for during the postpartum period. Risk factors that may increase the likelihood of getting the baby blues or postpartum depression include:  Having a personal or family history of depression.   Having depression while being pregnant.   Having premenstrual mood issues or mood issues related to oral contraceptives.  Having a lot of life stress.   Having marital conflict.   Lacking a social support network.   Having a baby with special needs.   Having health problems, such as diabetes.  SIGNS AND SYMPTOMS Symptoms of baby blues include:  Brief changes in mood, such as going from extreme happiness to sadness.  Decreased concentration.   Difficulty sleeping.   Crying spells, tearfulness.   Irritability.   Anxiety.  Symptoms of postpartum depression typically begin within the first month after giving birth. These symptoms include:  Difficulty  sleeping or excessive sleepiness.   Marked weight loss.   Agitation.   Feelings of worthlessness.   Lack of interest in activity or food.  Postpartum psychosis is a very serious condition and can be dangerous. Fortunately, it is rare. Displaying any of the following symptoms is cause for immediate medical attention. Symptoms of postpartum psychosis include:   Hallucinations and delusions.   Bizarre or disorganized behavior.   Confusion or disorientation.  DIAGNOSIS  A diagnosis is made by an evaluation of your symptoms. There are no medical or lab tests that lead to a diagnosis, but there are various questionnaires that a health care provider may use to identify those with the baby blues, postpartum depression, or psychosis. Often, a  screening tool called the [place] Postnatal Depression Scale is used to diagnose depression in the postpartum period.  TREATMENT The baby blues usually goes away on its own in 1-2 weeks. Social support is often all that is needed. You will be encouraged to get adequate sleep and rest. Occasionally, you may be given medicines to help you sleep.  Postpartum depression requires treatment because it can last several months or longer if it is not treated. Treatment may include individual or group therapy, medicine, or both to address any social, physiological, and psychological factors that may play a role in the depression. Regular exercise, a healthy diet, rest, and social support may also be strongly recommended.  Postpartum psychosis is more serious and needs treatment right away. Hospitalization is often needed. HOME CARE INSTRUCTIONS  Get as much rest as you can. Nap when the baby sleeps.   Exercise regularly. Some women find yoga and walking to be beneficial.   Eat a balanced and nourishing diet.   Do little things that you enjoy. Have a cup of tea, take a bubble bath, read your favorite magazine, or listen to your favorite music.  Avoid  alcohol.   Ask for help with household chores, cooking, grocery shopping, or running errands as needed. Do not try to do everything.   Talk to people close to you about how you are feeling. Get support from your partner, family members, friends, or other new moms.  Try to stay positive in how you think. Think about the things you are grateful for.   Do not spend a lot of time alone.   Only take over-the-counter or prescription medicine as directed by your health care provider.  Keep all your postpartum appointments.   Let your health care provider know if you have any concerns.  SEEK MEDICAL CARE IF: You are having a reaction to or problems with your medicine. SEEK IMMEDIATE MEDICAL CARE IF:  You have suicidal feelings.   You think you may harm the baby or someone else. MAKE SURE YOU:  Understand these instructions.  Will watch your condition.  Will get help right away if you are not doing well or get worse.   This information is not intended to replace advice given to you by your health care provider. Make sure you discuss any questions you have with your health care provider.   Document Released: 07/11/2004 Document Revised: 10/12/2013 Document Reviewed: 07/19/2013 Elsevier Interactive Patient Education 2016 [employer] Inc[employer] Postpartum Care After Vaginal Delivery After you deliver your newborn (postpartum period), the usual stay in the hospital is 24-72 hours. If there were problems with your labor or delivery, or if you have other medical problems, you might be in the hospital longer.  While you are in the hospital, you will receive help and instructions on how to care for yourself and your newborn during the postpartum period.  While you are in the hospital:  Be sure to tell your nurses if you have pain or discomfort, as well as where you feel the pain and what makes the pain worse.  If you had an incision made near your vagina (episiotomy) or if you had some tearing  during delivery, the nurses may put ice packs on your episiotomy or tear. The ice packs may help to reduce the pain and swelling.  If you are breastfeeding, you may feel uncomfortable contractions of your uterus for a couple of weeks. This is normal. The contractions help your uterus get back to normal size.  It is normal to have some bleeding after delivery.  For the first 1-3 days after delivery, the flow is red and the amount may be similar to a period.  It is common for the flow to start and stop.  In the first few days, you may pass some small clots. Let your nurses know if you begin to pass large clots or your flow increases.  Do not  flush blood clots down the toilet before having the nurse look at them.  During the next 3-10 days after delivery, your flow should become more watery and pink or brown-tinged in color.  Ten to fourteen days after delivery, your flow should be a small amount of yellowish-white discharge.  The amount of your flow will decrease over the first few weeks after delivery. Your flow may stop in 6-8 weeks. Most women have had their flow stop by 12 weeks after delivery.  You should change your sanitary pads frequently.  Wash your hands thoroughly with soap and water for at least 20 seconds after changing pads, using the toilet, or before holding or feeding your newborn.  You should feel like you need to empty your bladder within the first 6-8 hours after delivery.  In case you become weak, lightheaded, or faint, call your nurse before you get out of bed for the first time and before you take a shower for the first time.  Within the first few days after delivery, your breasts may begin to feel tender and full. This is called engorgement. Breast tenderness usually goes away within 48-72 hours after engorgement occurs. You may also notice milk leaking from your breasts. If you are not breastfeeding, do not stimulate your breasts. Breast stimulation can make your  breasts produce more milk.  Spending as much time as possible with your newborn is very important. During this time, you and your newborn can feel close and get to know each other. Having your newborn stay in your room (rooming in) will help to strengthen the bond with your newborn. It will give you time to get to know your newborn and become comfortable caring for your newborn.  Your hormones change after delivery. Sometimes the hormone changes can temporarily cause you to feel sad or tearful. These feelings should not last more than a few days. If these feelings last longer than that, you should talk to your caregiver.  If desired, talk to your caregiver about methods of family planning or contraception.  Talk to your caregiver about immunizations. Your caregiver may want you to have the following immunizations before leaving the hospital:  Tetanus, diphtheria, and pertussis (Tdap) or tetanus and diphtheria (Td) immunization. It is very important that you and your family (including grandparents) or others caring for your newborn are up-to-date with the Tdap or Td immunizations. The Tdap or Td immunization can help protect your newborn from getting ill.  Rubella immunization.  Varicella (chickenpox) immunization.  Influenza immunization. You should receive this annual immunization if you did not receive the immunization during your pregnancy.   This information is not intended to replace advice given to you by your health care provider. Make sure you discuss any questions you have with your health care provider.   Document Released: 08/04/2007 Document Revised: 07/01/2012 Document Reviewed: 06/03/2012 Elsevier Interactive Patient Education 2016 Elsevier Inc. Breastfeeding and Mastitis Mastitis is inflammation of the breast tissue. It can occur in women who are breastfeeding. This can make breastfeeding painful. Mastitis will sometimes go away on its own.  Your health care provider will help  determine if treatment is needed. CAUSES Mastitis is often associated with a blocked milk (lactiferous) duct. This can happen when too much milk builds up in the breast. Causes of excess milk in the breast can include:  Poor latch-on. If your baby is not latched onto the breast properly, she or he may not empty your breast completely while breastfeeding.  Allowing too much time to pass between feedings.  Wearing a bra or other clothing that is too tight. This puts extra pressure on the lactiferous ducts so milk does not flow through them as it should. Mastitis can also be caused by a bacterial infection. Bacteria may enter the breast tissue through cuts or openings in the skin. In women who are breastfeeding, this may occur because of cracked or irritated skin. Cracks in the skin are often caused when your baby does not latch on properly to the breast. SIGNS AND SYMPTOMS  Swelling, redness, tenderness, and pain in an area of the breast.  Swelling of the glands under the arm on the same side.  Fever may or may not accompany mastitis. If an infection is allowed to progress, a collection of pus (abscess) may develop. DIAGNOSIS  Your health care provider can usually diagnose mastitis based on your symptoms and a physical exam. Tests may be done to help confirm the diagnosis. These may include:  Removal of pus from the breast by applying pressure to the area. This pus can be examined in the lab to determine which bacteria are present. If an abscess has developed, the fluid in the abscess can be removed with a needle. This can also be used to confirm the diagnosis and determine the bacteria present. In most cases, pus will not be present.  Blood tests to determine if your body is fighting a bacterial infection.  Mammogram or ultrasound tests to rule out other problems or diseases. TREATMENT  Mastitis that occurs with breastfeeding will sometimes go away on its own. Your health care provider may  choose to wait 24 hours after first seeing you to decide whether a prescription medicine is needed. If your symptoms are worse after 24 hours, your health care provider will likely prescribe an antibiotic medicine to treat the mastitis. He or she will determine which bacteria are most likely causing the infection and will then select an appropriate antibiotic medicine. This is sometimes changed based on the results of tests performed to identify the bacteria, or if there is no response to the antibiotic medicine selected. Antibiotic medicines are usually given by mouth. You may also be given medicine for pain. HOME CARE INSTRUCTIONS  Only take over-the-counter or prescription medicines for pain, fever, or discomfort as directed by your health care provider.  If your health care provider prescribed an antibiotic medicine, take the medicine as directed. Make sure you finish it even if you start to feel better.  Do not wear a tight or underwire bra. Wear a soft, supportive bra.  Increase your fluid intake, especially if you have a fever.  Continue to empty the breast. Your health care provider can tell you whether this milk is safe for your infant or needs to be thrown out. You may be told to stop nursing until your health care provider thinks it is safe for your baby. Use a breast pump if you are advised to stop nursing.  Keep your nipples clean and dry.  Empty the first breast completely before going to the other breast.  If your baby is not emptying your breasts completely for some reason, use a breast pump to empty your breasts.  If you go back to work, pump your breasts while at work to stay in time with your nursing schedule.  Avoid allowing your breasts to become overly filled with milk (engorged). SEEK MEDICAL CARE IF:  You have pus-like discharge from the breast.  Your symptoms do not improve with the treatment prescribed by your health care provider within 2 days. SEEK IMMEDIATE  MEDICAL CARE IF:  Your pain and swelling are getting worse.  You have pain that is not controlled with medicine.  You have a red line extending from the breast toward your armpit.  You have a fever or persistent symptoms for more than 2-3 days.  You have a fever and your symptoms suddenly get worse. MAKE SURE YOU:   Understand these instructions.  Will watch your condition.  Will get help right away if you are not doing well or get worse.   This information is not intended to replace advice given to you by your health care provider. Make sure you discuss any questions you have with your health care provider.   Document Released: 02/01/2005 Document Revised: 10/12/2013 Document Reviewed: 05/13/2013 Elsevier Interactive Patient Education [employer] Breastfeeding Deciding to breastfeed is one of the best choices you can make for you and your baby. A change in hormones during pregnancy causes your breast tissue to grow and increases the number and size of your milk ducts. These hormones also allow proteins, sugars, and fats from your blood supply to make breast milk in your milk-producing glands. Hormones prevent breast milk from being released before your baby is born as well as prompt milk flow after birth. Once breastfeeding has begun, thoughts of your baby, as well as his or her sucking or crying, can stimulate the release of milk from your milk-producing glands.  BENEFITS OF BREASTFEEDING For Your Baby  Your first milk (colostrum) helps your baby's digestive system function better.  There are antibodies in your milk that help your baby fight off infections.  Your baby has a lower incidence of asthma, allergies, and sudden infant death syndrome.  The nutrients in breast milk are better for your baby than infant formulas and are designed uniquely for your baby's needs.  Breast milk improves your baby's brain development.  Your baby is less likely to develop other  conditions, such as childhood obesity, asthma, or type 2 diabetes mellitus. For You  Breastfeeding helps to create a very special bond between you and your baby.  Breastfeeding is convenient. Breast milk is always available at the correct temperature and costs nothing.  Breastfeeding helps to burn calories and helps you lose the weight gained during pregnancy.  Breastfeeding makes your uterus contract to its prepregnancy size faster and slows bleeding (lochia) after you give birth.   Breastfeeding helps to lower your risk of developing type 2 diabetes mellitus, osteoporosis, and breast or ovarian cancer later in life. SIGNS THAT YOUR BABY IS HUNGRY Early Signs of Hunger  Increased alertness or activity.  Stretching.  Movement of the head from side to side.  Movement of the head and opening of the mouth when the corner of the mouth or cheek is stroked (rooting).  Increased sucking sounds, smacking lips, cooing, sighing, or squeaking.  Hand-to-mouth movements.  Increased sucking of fingers or hands. Late Signs of Hunger  Fussing.  Intermittent crying. Extreme Signs of Hunger Signs of extreme hunger  will require calming and consoling before your baby will be able to breastfeed successfully. Do not wait for the following signs of extreme hunger to occur before you initiate breastfeeding:  Restlessness.  A loud, strong cry.  Screaming. BREASTFEEDING BASICS Breastfeeding Initiation  Find a comfortable place to sit or lie down, with your neck and back well supported.  Place a pillow or rolled up blanket under your baby to bring him or her to the level of your breast (if you are seated). Nursing pillows are specially designed to help support your arms and your baby while you breastfeed.  Make sure that your baby's abdomen is facing your abdomen.  Gently massage your breast. With your fingertips, massage from your chest wall toward your nipple in a circular motion. This  encourages milk flow. You may need to continue this action during the feeding if your milk flows slowly.  Support your breast with 4 fingers underneath and your thumb above your nipple. Make sure your fingers are well away from your nipple and your baby's mouth.  Stroke your baby's lips gently with your finger or nipple.  When your baby's mouth is open wide enough, quickly bring your baby to your breast, placing your entire nipple and as much of the colored area around your nipple (areola) as possible into your baby's mouth.  More areola should be visible above your baby's upper lip than below the lower lip.  Your baby's tongue should be between his or her lower gum and your breast.  Ensure that your baby's mouth is correctly positioned around your nipple (latched). Your baby's lips should create a seal on your breast and be turned out (everted).  It is common for your baby to suck about 2-3 minutes in order to start the flow of breast milk. Latching Teaching your baby how to latch on to your breast properly is very important. An improper latch can cause nipple pain and decreased milk supply for you and poor weight gain in your baby. Also, if your baby is not latched onto your nipple properly, he or she may swallow some air during feeding. This can make your baby fussy. Burping your baby when you switch breasts during the feeding can help to get rid of the air. However, teaching your baby to latch on properly is still the best way to prevent fussiness from swallowing air while breastfeeding. Signs that your baby has successfully latched on to your nipple:  Silent tugging or silent sucking, without causing you pain.  Swallowing heard between every 3-4 sucks.  Muscle movement above and in front of his or her ears while sucking. Signs that your baby has not successfully latched on to nipple:  Sucking sounds or smacking sounds from your baby while breastfeeding.  Nipple pain. If you think  your baby has not latched on correctly, slip your finger into the corner of your baby's mouth to break the suction and place it between your baby's gums. Attempt breastfeeding initiation again. Signs of Successful Breastfeeding Signs from your baby:  A gradual decrease in the number of sucks or complete cessation of sucking.  Falling asleep.  Relaxation of his or her body.  Retention of a small amount of milk in his or her mouth.  Letting go of your breast by himself or herself. Signs from you:  Breasts that have increased in firmness, weight, and size 1-3 hours after feeding.  Breasts that are softer immediately after breastfeeding.  Increased milk volume, as well as a  change in milk consistency and color by the fifth day of breastfeeding.  Nipples that are not sore, cracked, or bleeding. Signs That Your Pecola LeisureBaby is Getting Enough Milk  Wetting at least 3 diapers in a 24-hour period. The urine should be clear and pale yellow by age 637 days.  At least 3 stools in a 24-hour period by age 637 days. The stool should be soft and yellow.  At least 3 stools in a 24-hour period by age 76 days. The stool should be seedy and yellow.  No loss of weight greater than 10% of birth weight during the first 133 days of age.  Average weight gain of 4-7 ounces (113-198 g) per week after age 63 days.  Consistent daily weight gain by age 637 days, without weight loss after the age of 2 weeks. After a feeding, your baby may spit up a small amount. This is common. BREASTFEEDING FREQUENCY AND DURATION Frequent feeding will help you make more milk and can prevent sore nipples and breast engorgement. Breastfeed when you feel the need to reduce the fullness of your breasts or when your baby shows signs of hunger. This is called "breastfeeding on demand." Avoid introducing a pacifier to your baby while you are working to establish breastfeeding (the first 4-6 weeks after your baby is born). After this time you may  choose to use a pacifier. Research has shown that pacifier use during the first year of a baby's life decreases the risk of sudden infant death syndrome (SIDS). Allow your baby to feed on each breast as long as he or she wants. Breastfeed until your baby is finished feeding. When your baby unlatches or falls asleep while feeding from the first breast, offer the second breast. Because newborns are often sleepy in the first few weeks of life, you may need to awaken your baby to get him or her to feed. Breastfeeding times will vary from baby to baby. However, the following rules can serve as a guide to help you ensure that your baby is properly fed:  Newborns (babies 514 weeks of age or younger) may breastfeed every 1-3 hours.  Newborns should not go longer than 3 hours during the day or 5 hours during the night without breastfeeding.  You should breastfeed your baby a minimum of 8 times in a 24-hour period until you begin to introduce solid foods to your baby at around 426 months of age. BREAST MILK PUMPING Pumping and storing breast milk allows you to ensure that your baby is exclusively fed your breast milk, even at times when you are unable to breastfeed. This is especially important if you are going back to work while you are still breastfeeding or when you are not able to be present during feedings. Your lactation consultant can give you guidelines on how long it is safe to store breast milk. A breast pump is a machine that allows you to pump milk from your breast into a sterile bottle. The pumped breast milk can then be stored in a refrigerator or freezer. Some breast pumps are operated by hand, while others use electricity. Ask your lactation consultant which type will work best for you. Breast pumps can be purchased, but some hospitals and breastfeeding support groups lease breast pumps on a monthly basis. A lactation consultant can teach you how to hand express breast milk, if you prefer not to use a  pump. CARING FOR YOUR BREASTS WHILE YOU BREASTFEED Nipples can become dry, cracked, and sore while  breastfeeding. The following recommendations can help keep your breasts moisturized and healthy:  Avoid using soap on your nipples.  Wear a supportive bra. Although not required, special nursing bras and tank tops are designed to allow access to your breasts for breastfeeding without taking off your entire bra or top. Avoid wearing underwire-style bras or extremely tight bras.  Air dry your nipples for 3-52minutes after each feeding.  Use only cotton bra pads to absorb leaked breast milk. Leaking of breast milk between feedings is normal.  Use lanolin on your nipples after breastfeeding. Lanolin helps to maintain your skin's normal moisture barrier. If you use pure lanolin, you do not need to wash it off before feeding your baby again. Pure lanolin is not toxic to your baby. You may also hand express a few drops of breast milk and gently massage that milk into your nipples and allow the milk to air dry. In the first few weeks after giving birth, some women experience extremely full breasts (engorgement). Engorgement can make your breasts feel heavy, warm, and tender to the touch. Engorgement peaks within 3-5 days after you give birth. The following recommendations can help ease engorgement:  Completely empty your breasts while breastfeeding or pumping. You may want to start by applying warm, moist heat (in the shower or with warm water-soaked hand towels) just before feeding or pumping. This increases circulation and helps the milk flow. If your baby does not completely empty your breasts while breastfeeding, pump any extra milk after he or she is finished.  Wear a snug bra (nursing or regular) or tank top for 1-2 days to signal your body to slightly decrease milk production.  Apply ice packs to your breasts, unless this is too uncomfortable for you.  Make sure that your baby is latched on and  positioned properly while breastfeeding. If engorgement persists after 48 hours of following these recommendations, contact your health care provider or a Advertising copywriter. OVERALL HEALTH CARE RECOMMENDATIONS WHILE BREASTFEEDING  Eat healthy foods. Alternate between meals and snacks, eating 3 of each per day. Because what you eat affects your breast milk, some of the foods may make your baby more irritable than usual. Avoid eating these foods if you are sure that they are negatively affecting your baby.  Drink milk, fruit juice, and water to satisfy your thirst (about 10 glasses a day).  Rest often, relax, and continue to take your prenatal vitamins to prevent fatigue, stress, and anemia.  Continue breast self-awareness checks.  Avoid chewing and smoking tobacco. Chemicals from cigarettes that pass into breast milk and exposure to secondhand smoke may harm your baby.  Avoid alcohol and drug use, including marijuana. Some medicines that may be harmful to your baby can pass through breast milk. It is important to ask your health care provider before taking any medicine, including all over-the-counter and prescription medicine as well as vitamin and herbal supplements. It is possible to become pregnant while breastfeeding. If birth control is desired, ask your health care provider about options that will be safe for your baby. SEEK MEDICAL CARE IF:  You feel like you want to stop breastfeeding or have become frustrated with breastfeeding.  You have painful breasts or nipples.  Your nipples are cracked or bleeding.  Your breasts are red, tender, or warm.  You have a swollen area on either breast.  You have a fever or chills.  You have nausea or vomiting.  You have drainage other than breast milk from your nipples.  Your breasts do not become full before feedings by the fifth day after you give birth.  You feel sad and depressed.  Your baby is too sleepy to eat well.  Your  baby is having trouble sleeping.   Your baby is wetting less than 3 diapers in a 24-hour period.  Your baby has less than 3 stools in a 24-hour period.  Your baby's skin or the white part of his or her eyes becomes yellow.   Your baby is not gaining weight by 73 days of age. SEEK IMMEDIATE MEDICAL CARE IF:  Your baby is overly tired (lethargic) and does not want to wake up and feed.  Your baby develops an unexplained fever.   This information is not intended to replace advice given to you by your health care provider. Make sure you discuss any questions you have with your health care provider.   Document Released: 10/07/2005 Document Revised: 06/28/2015 Document Reviewed: 03/31/2013 Elsevier Interactive Patient Education [employer]

## 2015-08-14 NOTE — Lactation Note (Signed)
This note was copied from the chart of Alexis Butler. Lactation Consultation Note  Patient Name: Alexis Dorothea GlassmanSireesha Butler ZOXWR'UToday's Date: 08/14/2015 Reason for consult: Follow-up assessment;Other (Comment);Infant weight loss (6% weight loss , per mom the baby cluster fed until 310 feeding )  Baby is 42 hours old , Early term infant with 6% weight loss, ( 6-8.8 oz)  Breast feeding range 10 -30 mins , last fed at 0310 am ), latch scores 4-7's ( last at 0310 am - 7 )   2 voids, 6 stools, Per mom the baby cluster fed on and off all night until the 310 am feeding, hearing swallows and the longest baby has fed 20 -30 mins.  @ consult LC completely reviewed the steps for latching, and had mom return demo of breast massage , hand express, positioning and latch. @ consult, baby skin to skin , noted mom allowing baby to nibble onto the breast instead of waiting for baby to open wide and then latch.  After several attempts and allowing baby to suck on a gloved finger baby latched for 5 mins with swallows and depth, baby released.  LC suspects due to the baby cluster feeding last night , this is the baby's down time. LC recommended skin to skin and to call on the nurses light for Alexis Butler  To reassess latch when baby  is more awake. Latch score of 7 . [facility]BU RN aware .  Per mom nipples have been alittle tender since yesterday, using the comfort gels, and hand pump. LC assessed during assist with latch with moms permission  And nipples clear of breakdown, appear normal color. LC recommended prior to every latch - breast massage , hand express, pre-pump to prime the milk ducts,  Therefore enhancing let down .  Sore nipple and engorgement prevention and tx . LC reviewed hand pump. Also per mom has a DEBP at home.  LC recommended when baby has a sleepy time - to post pump both breast for 10 mins to enhance milk coming in.  [facility] reviewed the BF information from the Baby and me booklet.  Per [facility]BU RN  - mom will be F/U @[facility]  for lactation appt.  LC made mom aware.     Maternal Data Has patient been taught Hand Expression?: Yes Does the patient have breastfeeding experience prior to this delivery?: No  Feeding Feeding Type: Breast Fed Length of feed: 5 min (several attempts and then latched for 5 mins with swallows )  LATCH Score/Interventions Latch: Repeated attempts needed to sustain latch, nipple held in mouth throughout feeding, stimulation needed to elicit sucking reflex. Intervention(s): Adjust position;Assist with latch;Breast massage;Breast compression  Audible Swallowing: A few with stimulation  Type of Nipple: Everted at rest and after stimulation  Comfort (Breast/Nipple): Soft / non-tender     Hold (Positioning): Assistance needed to correctly position infant at breast and maintain latch. Intervention(s): Breastfeeding basics reviewed;Support Pillows;Position options;Skin to skin (reviewed the Breast feeding teaching from the baby and me booklet )  LATCH Score: 7  Lactation Tools Discussed/Used Tools: Pump Breast pump type: Manual WIC Program: No Pump Review: Setup, frequency, and cleaning;Milk Storage Initiated by:: MAI , Reviewed  Date initiated:: 08/14/15   Consult Status Consult Status: Follow-up (LC encouraged  mom to call for for another feeding assesment ) Date: 08/14/15 Follow-up type: In-patient    Kathrin Greathouseorio, Alexis Butler 08/14/2015, 11:24 AM

## 2015-08-14 NOTE — Discharge Summary (Signed)
OB Discharge Summary     Patient Name: Alexis Butler DOB: 1986/01/25 MRN: [id number]  Date of admission: 08/12/2015 Delivering MD: MODY, VAISHALI   Date of discharge: 08/14/2015  Admitting diagnosis: 39wks, CTX, [place] Intrauterine pregnancy: [redacted]w[redacted]d     Secondary diagnosis:  Principal Problem:   Postpartum care following vaginal delivery (10/22) Active Problems:   Normal labor   Chorioamnionitis   Iron deficiency anemia of pregnancy   Acute blood loss anemia  Additional problems: N/A     Discharge diagnosis: Term Pregnancy Delivered                                                                                                Post partum procedures:none  Augmentation: Pitocin  Complications: None  Hospital course:  Onset of Labor With Vaginal Delivery     29 y.o. yo G1P1001 at [redacted]w[redacted]d was admitted in Active Laboron 08/12/2015. Patient had an uncomplicated labor course as follows:  Membrane Rupture Time/Date: 11:00 AM ,08/12/2015   Intrapartum Procedures: Episiotomy: None [1]                                         Lacerations:  2nd degree [3]  Patient had a delivery of a Viable infant. 08/12/2015  Information for the patient's newborn:  Alexis Butler [[id number]]  Delivery Method: Vag-Spont    Pateint had an uncomplicated postpartum course.  She is ambulating, tolerating a regular diet, passing flatus, and urinating well. Patient is discharged home in stable condition on 08/14/2015  4:30 PM.    Physical exam  Filed Vitals:   08/13/15 0005 08/13/15 1824 08/13/15 1849 08/14/15 0600  BP: 99/57  101/60 101/69  Pulse: 93 85  84  Temp: 97.9 F (36.6 C) 98.2 F (36.8 C)  97.4 F (36.3 C)  TempSrc: Oral Oral    Resp: Height:      Weight:      SpO2:       General: alert, cooperative and no distress Lochia: appropriate Uterine Fundus: firm DVT Evaluation: No evidence of DVT seen on physical exam. Negative Homan's sign. No cords or calf  tenderness. No significant calf/ankle edema. Labs: Lab Results  Component Value Date   WBC 14.7* 08/13/2015   HGB 8.8* 08/13/2015   HCT 26.5* 08/13/2015   MCV 88.3 08/13/2015   PLT 190 08/13/2015   No flowsheet data found.  Discharge instruction: per After Visit Summary and "Baby and Me Booklet".  Medications:  Current facility-administered medications:  .  acetaminophen (TYLENOL) tablet 650 mg, 650 mg, Oral, Q4H PRN, Shea Evans, MD .  benzocaine-Menthol (DERMOPLAST) 20-0.5 % topical spray 1 application, 1 application, Topical, PRN, Shea Evans, MD, 1 application at 08/13/15 0002 .  witch hazel-glycerin (TUCKS) pad 1 application, 1 application, Topical, PRN, 1 application at 08/13/15 0346 **AND** dibucaine (NUPERCAINAL) 1 % rectal ointment 1 application, 1 application, Rectal, PRN, Shea Evans, MD, 1 application at 08/13/15 0002 .  diphenhydrAMINE (BENADRYL) capsule 25 mg, 25 mg,  Oral, Q6H PRN, Shea EvansVaishali Mody, MD .  ibuprofen (ADVIL,MOTRIN) tablet 600 mg, 600 mg, Oral, 4 times per day, Shea EvansVaishali Mody, MD, 600 mg at 08/14/15 0602 .  iron polysaccharides (NIFEREX) capsule 150 mg, 150 mg, Oral, BID, Lawernce PittsMelanie N Bhambri, [name], 150 mg at 08/14/15 0943 .  lanolin ointment, , Topical, PRN, Shea EvansVaishali Mody, MD .  lidocaine (PF) (XYLOCAINE) 1 % injection 30 mL, 30 mL, Subcutaneous, PRN, Shea EvansVaishali Mody, MD, 30 mL at 08/12/15 1652 .  magnesium oxide (MAG-OX) tablet 200 mg, 200 mg, Oral, Daily, Lawernce PittsMelanie N Bhambri, [name], 200 mg at 08/14/15 0943 .  ondansetron (ZOFRAN) tablet 4 mg, 4 mg, Oral, Q4H PRN, 4 mg at 08/12/15 2106 **OR** ondansetron (ZOFRAN) injection 4 mg, 4 mg, Intravenous, Q4H PRN, Shea EvansVaishali Mody, MD .  oxyCODONE-acetaminophen (PERCOCET/ROXICET) 5-325 MG per tablet 1 tablet, 1 tablet, Oral, Q4H PRN, Shea EvansVaishali Mody, MD, 1 tablet at 08/14/15 0602 .  prenatal multivitamin tablet 1 tablet, 1 tablet, Oral, Q1200, Shea EvansVaishali Mody, MD, 1 tablet at 08/13/15 1206 .  senna-docusate (Senokot-S) tablet 2  tablet, 2 tablet, Oral, Q24H, Shea EvansVaishali Mody, MD, 2 tablet at 08/14/15 0013 .  simethicone (MYLICON) chewable tablet 80 mg, 80 mg, Oral, PRN, Shea EvansVaishali Mody, MD .  Tdap (BOOSTRIX) injection 0.5 mL, 0.5 mL, Intramuscular, Once, Shea EvansVaishali Mody, MD, 0.5 mL at 08/13/15 1212 .  zolpidem (AMBIEN) tablet 5 mg, 5 mg, Oral, QHS PRN, Shea EvansVaishali Mody, MD  Facility-Administered Medications Ordered in Other Encounters:  .  lidocaine (PF) (XYLOCAINE) 1 % injection, , , Anesthesia Intra-op, Phillips GroutPeter Carignan, MD, 5 mL at 08/12/15 0816 After visit meds:    Medication List    STOP taking these medications        SLOW FE PO      TAKE these medications        ibuprofen 600 MG tablet  Commonly known as:  ADVIL,MOTRIN  Take 1 tablet (600 mg total) by mouth every 6 (six) hours.     iron polysaccharides 150 MG capsule  Commonly known as:  NIFEREX  Take 1 capsule (150 mg total) by mouth 2 (two) times daily.     magnesium oxide 400 (241.3 MG) MG tablet  Commonly known as:  MAG-OX  Take 0.5 tablets (200 mg total) by mouth daily.     prenatal multivitamin Tabs tablet  Take 1 tablet by mouth daily at 12 noon.        Diet: routine diet  Activity: Advance as tolerated. Pelvic rest for 6 weeks.   Outpatient follow up:6 weeks Follow up Appt:No future appointments. Follow up Visit:No Follow-up on file.  Postpartum contraception: Undecided  Newborn Data: Live born female on 10[birth year]2202016 Birth Weight: 6 lb 15.6 oz (3165 g) APGAR: 8, 9  Baby Feeding: Breast Disposition:home with mother   08/14/2015 Alexis Butler, [name], [name], [name]

## 2019-05-22 HISTORY — PX: POLYPECTOMY: SHX149

## 2020-05-01 ENCOUNTER — Other Ambulatory Visit: Payer: Self-pay

## 2020-05-01 ENCOUNTER — Inpatient Hospital Stay (HOSPITAL_COMMUNITY)
Admission: AD | Admit: 2020-05-01 | Discharge: 2020-05-01 | Disposition: A | Discharge status: Home / Self Care | Payer: 59 | Attending: Obstetrics & Gynecology | Admitting: Obstetrics & Gynecology

## 2020-05-01 ENCOUNTER — Encounter (HOSPITAL_COMMUNITY): Payer: Self-pay | Admitting: Obstetrics & Gynecology

## 2020-05-01 DIAGNOSIS — O219 Vomiting of pregnancy, unspecified: Secondary | ICD-10-CM

## 2020-05-01 DIAGNOSIS — Z3A01 Less than 8 weeks gestation of pregnancy: Secondary | ICD-10-CM | POA: Diagnosis not present

## 2020-05-01 DIAGNOSIS — Z79899 Other long term (current) drug therapy: Secondary | ICD-10-CM | POA: Insufficient documentation

## 2020-05-01 LAB — URINALYSIS, ROUTINE W REFLEX MICROSCOPIC
Bilirubin Urine: NEGATIVE
Glucose, UA: NEGATIVE mg/dL
Ketones, ur: >80 mg/dL — AB
Leukocytes,Ua: NEGATIVE
Nitrite: NEGATIVE
Protein, ur: NEGATIVE mg/dL
Specific Gravity, Urine: >1.03 — ABNORMAL HIGH (ref 1.005–1.030)
pH: 5.5 (ref 5.0–8.0)

## 2020-05-01 LAB — URINALYSIS, MICROSCOPIC (REFLEX)

## 2020-05-01 LAB — POCT PREGNANCY, URINE: Preg Test, Ur: POSITIVE — AB

## 2020-05-01 MED ORDER — LACTATED RINGERS IV BOLUS
1000.0000 mL | Freq: Once | INTRAVENOUS | Status: AC
  Administered 2020-05-01: 1000 mL via INTRAVENOUS

## 2020-05-01 MED ORDER — FAMOTIDINE IN NACL 20-0.9 MG/50ML-% IV SOLN
20.0000 mg | Freq: Once | INTRAVENOUS | Status: AC
  Administered 2020-05-01: 20 mg via INTRAVENOUS
  Filled 2020-05-01: qty 50

## 2020-05-01 MED ORDER — PROCHLORPERAZINE MALEATE 10 MG PO TABS: 10.0000 mg | ORAL_TABLET | Freq: Two times a day (BID) | ORAL | 0 refills | Status: AC | PRN

## 2020-05-01 MED ORDER — THIAMINE HCL 100 MG/ML IJ SOLN
Freq: Once | INTRAVENOUS | Status: AC
  Filled 2020-05-01: qty 1000

## 2020-05-01 MED ORDER — PROMETHAZINE HCL 25 MG/ML IJ SOLN
25.0000 mg | Freq: Once | INTRAMUSCULAR | Status: AC
  Administered 2020-05-01: 25 mg via INTRAVENOUS
  Filled 2020-05-01: qty 1

## 2020-05-01 NOTE — MAU Note (Signed)
.   Alexis Butler is a 34 y.o. at [redacted]w[redacted]d here in [facility]U reporting: she has had N/V for 6 days. PT was suppose to go to her 1st appointment today but called them and they told her to come here. Denies any pain or VB LMP: 03/21/20 Onset of complaint: 6 days Pain score: 0 Vitals:   05/01/20 1647  BP: 106/72  Pulse: 72  Resp: 16  Temp: 98.2 F (36.8 C)  SpO2: 100%     FHT: Lab orders placed from triage: UA UPT
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
## 2020-05-02 ENCOUNTER — Other Ambulatory Visit: Payer: Self-pay | Admitting: Obstetrics & Gynecology

## 2020-05-08 ENCOUNTER — Other Ambulatory Visit: Payer: Self-pay | Admitting: Obstetrics & Gynecology

## 2020-05-08 ENCOUNTER — Other Ambulatory Visit: Payer: Self-pay

## 2020-05-08 ENCOUNTER — Encounter (HOSPITAL_BASED_OUTPATIENT_CLINIC_OR_DEPARTMENT_OTHER): Payer: Self-pay | Admitting: Obstetrics & Gynecology

## 2020-05-08 ENCOUNTER — Other Ambulatory Visit (HOSPITAL_COMMUNITY): Payer: Self-pay | Admitting: Obstetrics & Gynecology

## 2020-05-08 DIAGNOSIS — Z419 Encounter for procedure for purposes other than remedying health state, unspecified: Secondary | ICD-10-CM

## 2020-05-08 DIAGNOSIS — Z640 Problems related to unwanted pregnancy: Secondary | ICD-10-CM

## 2020-05-08 NOTE — Progress Notes (Signed)
Spoke w/ via phone for pre-op interview--- PT Lab needs dos---- no              Lab results------ no COVID test ------ 05-09-2020 @ 1130 Arrive at ------- 1115 NPO after MN NO Solid Food.  Clear liquids from MN until--- 0915 Medications to take morning of surgery ----- may take nausea med w/ sips of water if needed Diabetic medication ----- n/a Patient Special Instructions ----- n/a Pre-Op special Istructions ----- n/a Patient verbalized understanding of instructions that were given at this phone interview. Patient denies shortness of breath, chest pain, fever, cough a this phone interview.

## 2020-05-09 ENCOUNTER — Other Ambulatory Visit (HOSPITAL_COMMUNITY)
Admission: RE | Admit: 2020-05-09 | Discharge: 2020-05-09 | Disposition: A | Discharge status: Home / Self Care | Payer: 59 | Source: Ambulatory Visit | Attending: Obstetrics & Gynecology | Admitting: Obstetrics & Gynecology

## 2020-05-09 DIAGNOSIS — Z20822 Contact with and (suspected) exposure to covid-19: Secondary | ICD-10-CM | POA: Diagnosis not present

## 2020-05-09 DIAGNOSIS — Z01812 Encounter for preprocedural laboratory examination: Secondary | ICD-10-CM | POA: Diagnosis not present

## 2020-05-09 LAB — SARS CORONAVIRUS 2 (TAT 6-24 HRS): SARS Coronavirus 2: NEGATIVE

## 2020-05-10 ENCOUNTER — Other Ambulatory Visit: Payer: Self-pay

## 2020-05-10 ENCOUNTER — Ambulatory Visit (HOSPITAL_BASED_OUTPATIENT_CLINIC_OR_DEPARTMENT_OTHER): Payer: 59 | Admitting: Anesthesiology

## 2020-05-10 ENCOUNTER — Encounter (HOSPITAL_BASED_OUTPATIENT_CLINIC_OR_DEPARTMENT_OTHER)
Admission: RE | Disposition: A | Discharge status: Home / Self Care | Payer: Self-pay | Source: Home / Self Care | Attending: Obstetrics & Gynecology

## 2020-05-10 ENCOUNTER — Encounter (HOSPITAL_BASED_OUTPATIENT_CLINIC_OR_DEPARTMENT_OTHER): Payer: Self-pay | Admitting: Obstetrics & Gynecology

## 2020-05-10 ENCOUNTER — Ambulatory Visit (HOSPITAL_BASED_OUTPATIENT_CLINIC_OR_DEPARTMENT_OTHER)
Admission: RE | Admit: 2020-05-10 | Discharge: 2020-05-10 | Disposition: A | Discharge status: Home / Self Care | Payer: 59 | Attending: Obstetrics & Gynecology | Admitting: Obstetrics & Gynecology

## 2020-05-10 ENCOUNTER — Encounter (HOSPITAL_COMMUNITY): Payer: Self-pay

## 2020-05-10 ENCOUNTER — Ambulatory Visit (HOSPITAL_COMMUNITY): Admission: RE | Admit: 2020-05-10 | Payer: 59 | Source: Ambulatory Visit | Admitting: Obstetrics & Gynecology

## 2020-05-10 DIAGNOSIS — Z640 Problems related to unwanted pregnancy: Secondary | ICD-10-CM | POA: Diagnosis not present

## 2020-05-10 DIAGNOSIS — Z3A01 Less than 8 weeks gestation of pregnancy: Secondary | ICD-10-CM | POA: Diagnosis not present

## 2020-05-10 DIAGNOSIS — O210 Mild hyperemesis gravidarum: Secondary | ICD-10-CM | POA: Diagnosis present

## 2020-05-10 HISTORY — DX: Vomiting of pregnancy, unspecified: O21.9

## 2020-05-10 HISTORY — PX: DILATION AND EVACUATION: SHX1459

## 2020-05-10 LAB — BASIC METABOLIC PANEL
Anion gap: 19 — ABNORMAL HIGH (ref 5–15)
BUN: 10 mg/dL (ref 6–20)
CO2: 18 mmol/L — ABNORMAL LOW (ref 22–32)
Calcium: 9.6 mg/dL (ref 8.9–10.3)
Chloride: 98 mmol/L (ref 98–111)
Creatinine, Ser: 0.51 mg/dL (ref 0.44–1.00)
GFR calc Af Amer: >60 mL/min (ref >60)
GFR calc non Af Amer: >60 mL/min (ref >60)
Glucose, Bld: 83 mg/dL (ref 70–99)
Potassium: 3.6 mmol/L (ref 3.5–5.1)
Sodium: 135 mmol/L (ref 135–145)

## 2020-05-10 LAB — CBC
HCT: 39.7 % (ref 36.0–46.0)
Hemoglobin: 14.2 g/dL (ref 12.0–15.0)
MCH: 31.3 pg (ref 26.0–34.0)
MCHC: 35.8 g/dL (ref 30.0–36.0)
MCV: 87.6 fL (ref 80.0–100.0)
Platelets: 309 10*3/uL (ref 150–400)
RBC: 4.53 MIL/uL (ref 3.87–5.11)
RDW: 12.1 % (ref 11.5–15.5)
WBC: 7.5 10*3/uL (ref 4.0–10.5)
nRBC: 0 % (ref 0.0–0.2)

## 2020-05-10 LAB — TYPE AND SCREEN
ABO/RH(D): O POS
Antibody Screen: NEGATIVE

## 2020-05-10 SURGERY — DILATION AND EVACUATION, UTERUS
Anesthesia: General | Site: Uterus

## 2020-05-10 MED ORDER — PROPOFOL 10 MG/ML IV BOLUS
INTRAVENOUS | Status: AC
  Filled 2020-05-10: qty 20

## 2020-05-10 MED ORDER — CEFAZOLIN SODIUM-DEXTROSE 2-4 GM/100ML-% IV SOLN
2.0000 g | INTRAVENOUS | Status: AC
  Administered 2020-05-10: 2 g via INTRAVENOUS

## 2020-05-10 MED ORDER — SUCCINYLCHOLINE CHLORIDE 200 MG/10ML IV SOSY
PREFILLED_SYRINGE | INTRAVENOUS | Status: AC
  Filled 2020-05-10: qty 10

## 2020-05-10 MED ORDER — OXYCODONE HCL 5 MG PO TABS: 5.0000 mg | ORAL_TABLET | Freq: Once | ORAL | Status: DC | PRN

## 2020-05-10 MED ORDER — CHLOROPROCAINE HCL 1 % IJ SOLN
INTRAMUSCULAR | Status: AC
  Filled 2020-05-10: qty 30

## 2020-05-10 MED ORDER — PROMETHAZINE HCL 25 MG/ML IJ SOLN: 6.2500 mg | INTRAMUSCULAR | Status: DC | PRN

## 2020-05-10 MED ORDER — OXYCODONE HCL 5 MG/5ML PO SOLN: 5.0000 mg | Freq: Once | ORAL | Status: DC | PRN

## 2020-05-10 MED ORDER — CEFAZOLIN SODIUM-DEXTROSE 2-4 GM/100ML-% IV SOLN
INTRAVENOUS | Status: AC
  Filled 2020-05-10: qty 100

## 2020-05-10 MED ORDER — ACETAMINOPHEN 325 MG PO TABS
650.0000 mg | ORAL_TABLET | Freq: Four times a day (QID) | ORAL | Status: AC | PRN
Start: 2020-05-10 — End: ?

## 2020-05-10 MED ORDER — MIDAZOLAM HCL 5 MG/5ML IJ SOLN
INTRAMUSCULAR | Status: DC | PRN
  Administered 2020-05-10: 2 mg via INTRAVENOUS

## 2020-05-10 MED ORDER — LIDOCAINE HCL (CARDIAC) PF 100 MG/5ML IV SOSY
PREFILLED_SYRINGE | INTRAVENOUS | Status: DC | PRN
  Administered 2020-05-10: 60 mg via INTRAVENOUS

## 2020-05-10 MED ORDER — MIDAZOLAM HCL 2 MG/2ML IJ SOLN
INTRAMUSCULAR | Status: AC
  Filled 2020-05-10: qty 2

## 2020-05-10 MED ORDER — FENTANYL CITRATE (PF) 100 MCG/2ML IJ SOLN
INTRAMUSCULAR | Status: DC | PRN
  Administered 2020-05-10: 100 ug via INTRAVENOUS

## 2020-05-10 MED ORDER — KETOROLAC TROMETHAMINE 30 MG/ML IJ SOLN
INTRAMUSCULAR | Status: DC | PRN
  Administered 2020-05-10: 30 mg via INTRAVENOUS

## 2020-05-10 MED ORDER — LACTATED RINGERS IV SOLN
INTRAVENOUS | Status: DC
  Administered 2020-05-10: 50 mL via INTRAVENOUS

## 2020-05-10 MED ORDER — DEXAMETHASONE SODIUM PHOSPHATE 10 MG/ML IJ SOLN
INTRAMUSCULAR | Status: AC
  Filled 2020-05-10: qty 1

## 2020-05-10 MED ORDER — DEXAMETHASONE SODIUM PHOSPHATE 10 MG/ML IJ SOLN
INTRAMUSCULAR | Status: DC | PRN
  Administered 2020-05-10: 4 mg via INTRAVENOUS

## 2020-05-10 MED ORDER — CARBOPROST TROMETHAMINE 250 MCG/ML IM SOLN
INTRAMUSCULAR | Status: AC
  Filled 2020-05-10: qty 1

## 2020-05-10 MED ORDER — FENTANYL CITRATE (PF) 100 MCG/2ML IJ SOLN
INTRAMUSCULAR | Status: AC
  Filled 2020-05-10: qty 2

## 2020-05-10 MED ORDER — METHYLERGONOVINE MALEATE 0.2 MG/ML IJ SOLN
INTRAMUSCULAR | Status: AC
  Filled 2020-05-10: qty 1

## 2020-05-10 MED ORDER — SUCCINYLCHOLINE CHLORIDE 200 MG/10ML IV SOSY
PREFILLED_SYRINGE | INTRAVENOUS | Status: DC | PRN
  Administered 2020-05-10: 100 mg via INTRAVENOUS

## 2020-05-10 MED ORDER — CHLOROPROCAINE HCL 1 % IJ SOLN
INTRAMUSCULAR | Status: DC | PRN
  Administered 2020-05-10: 30 mL

## 2020-05-10 MED ORDER — PROPOFOL 10 MG/ML IV BOLUS
INTRAVENOUS | Status: DC | PRN
  Administered 2020-05-10: 200 mg via INTRAVENOUS

## 2020-05-10 MED ORDER — POVIDONE-IODINE 10 % EX SWAB: 2.0000 "application " | Freq: Once | CUTANEOUS | Status: DC

## 2020-05-10 MED ORDER — ONDANSETRON HCL 4 MG/2ML IJ SOLN
INTRAMUSCULAR | Status: DC | PRN
  Administered 2020-05-10: 4 mg via INTRAVENOUS

## 2020-05-10 MED ORDER — FENTANYL CITRATE (PF) 100 MCG/2ML IJ SOLN: 25.0000 ug | INTRAMUSCULAR | Status: DC | PRN

## 2020-05-10 MED ORDER — IBUPROFEN 200 MG PO TABS
400.0000 mg | ORAL_TABLET | Freq: Four times a day (QID) | ORAL | 0 refills | Status: AC | PRN
Start: 2020-05-10 — End: ?

## 2020-05-10 MED ORDER — ONDANSETRON HCL 4 MG/2ML IJ SOLN
INTRAMUSCULAR | Status: AC
  Filled 2020-05-10: qty 2

## 2020-05-10 MED ORDER — LIDOCAINE 2% (20 MG/ML) 5 ML SYRINGE
INTRAMUSCULAR | Status: AC
  Filled 2020-05-10: qty 5

## 2020-05-10 SURGICAL SUPPLY — 19 items
COVER WAND RF STERILE (DRAPES) IMPLANT
DECANTER SPIKE VIAL GLASS SM (MISCELLANEOUS) ×3 IMPLANT
GLOVE BIO SURGEON STRL SZ 6.5 (GLOVE) ×3 IMPLANT
GLOVE BIO SURGEON STRL SZ7 (GLOVE) ×3 IMPLANT
GLOVE BIOGEL PI IND STRL 7.0 (GLOVE) ×4 IMPLANT
GLOVE BIOGEL PI INDICATOR 7.0 (GLOVE) ×2
GOWN STRL REUS W/TWL LRG LVL3 (GOWN DISPOSABLE) ×6 IMPLANT
KIT BERKELEY 1ST TRI 3/8 NO TR (MISCELLANEOUS) ×3 IMPLANT
KIT BERKELEY 1ST TRIMESTER 3/8 (MISCELLANEOUS) ×3 IMPLANT
NS IRRIG 1000ML POUR BTL (IV SOLUTION) ×3 IMPLANT
PACK VAGINAL MINOR WOMEN LF (CUSTOM PROCEDURE TRAY) ×3 IMPLANT
PAD OB MATERNITY 4.3X12.25 (PERSONAL CARE ITEMS) ×3 IMPLANT
PAD PREP 24X48 CUFFED NSTRL (MISCELLANEOUS) ×3 IMPLANT
SET BERKELEY SUCTION TUBING (SUCTIONS) ×3 IMPLANT
TOWEL OR 17X26 10 PK STRL BLUE (TOWEL DISPOSABLE) ×6 IMPLANT
VACURETTE 10 RIGID CVD (CANNULA) IMPLANT
VACURETTE 7MM CVD STRL WRAP (CANNULA) IMPLANT
VACURETTE 8 RIGID CVD (CANNULA) ×3 IMPLANT
VACURETTE 9 RIGID CVD (CANNULA) IMPLANT
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
## 2020-05-10 NOTE — Anesthesia Procedure Notes (Signed)
Procedure Name: Intubation Date/Time: 05/10/2020 1:40 PM Performed by: Jonna Munro, CRNA Pre-anesthesia Checklist: Patient identified, Emergency Drugs available, Patient being monitored, Suction available and Timeout performed Patient Re-evaluated:Patient Re-evaluated prior to induction Oxygen Delivery Method: Circle system utilized Preoxygenation: Pre-oxygenation with 100% oxygen Induction Type: IV induction Laryngoscope Size: Mac and 3 Tube type: Oral Tube size: 7.0 mm Number of attempts: 1 Airway Equipment and Method: Stylet Placement Confirmation: positive ETCO2 and breath sounds checked- equal and bilateral Secured at: 21 cm Tube secured with: Tape Dental Injury: Teeth and Oropharynx as per pre-operative assessment

## 2020-05-10 NOTE — Anesthesia Preprocedure Evaluation (Addendum)
Anesthesia Evaluation  Patient identified by MRN, date of birth, ID band Patient awake    Reviewed: Allergy & Precautions, NPO status , Patient's Chart, lab work & pertinent test results  History of Anesthesia Complications Negative for: history of anesthetic complications  Airway Mallampati: II  TM Distance: >3 FB Neck ROM: Full    Dental  (+) Teeth Intact   Pulmonary neg pulmonary ROS,    Pulmonary exam normal        Cardiovascular negative cardio ROS Normal cardiovascular exam     Neuro/Psych negative neurological ROS  negative psych ROS   GI/Hepatic Neg liver ROS, Severe hyperemesis gravidarum   Endo/Other  negative endocrine ROS  Renal/GU negative Renal ROS  negative genitourinary   Musculoskeletal negative musculoskeletal ROS (+)   Abdominal   Peds  Hematology negative hematology ROS (+)   Anesthesia Other Findings   Reproductive/Obstetrics (+) Pregnancy                            Anesthesia Physical Anesthesia Plan  ASA: II  Anesthesia Plan: General   Post-op Pain Management:    Induction: Intravenous, Rapid sequence and Cricoid pressure planned  PONV Risk Score and Plan: 3 and Ondansetron, Dexamethasone, Treatment may vary due to age or medical condition, Midazolam, TIVA and Propofol infusion  Airway Management Planned: Oral ETT  Additional Equipment: None  Intra-op Plan:   Post-operative Plan: Extubation in OR  Informed Consent: I have reviewed the patients History and Physical, chart, labs and discussed the procedure including the risks, benefits and alternatives for the proposed anesthesia with the patient or authorized representative who has indicated his/her understanding and acceptance.     Dental advisory given  Plan Discussed with:   Anesthesia Plan Comments:         Anesthesia Quick Evaluation

## 2020-05-10 NOTE — Discharge Instructions (Signed)

## 2020-05-10 NOTE — Transfer of Care (Signed)
Immediate Anesthesia Transfer of Care Note  Patient: Alexis Butler  Procedure(s) Performed: DILATATION AND EVACUATION (N/A Uterus)  Patient Location: PACU  Anesthesia Type:General  Level of Consciousness: awake, alert , oriented and patient cooperative  Airway & Oxygen Therapy: Patient Spontanous Breathing  Post-op Assessment: Report given to RN, Post -op Vital signs reviewed and stable and Patient moving all extremities X 4  Post vital signs: Reviewed and stable  Last Vitals:  Vitals Value Taken Time  BP    Temp    Pulse 83 05/10/20 1408  Resp    SpO2 99 % 05/10/20 1408  Vitals shown include unvalidated device data.  Last Pain:  Vitals:   05/10/20 1135  TempSrc: Oral  PainSc: 0-No pain      Patients Stated Pain Goal: 3 (05/10/20 1135)  Complications: No complications documented.

## 2020-05-10 NOTE — Op Note (Signed)
Preoperative diagnosis: 7.5 wks undesired pregnancy due to severe hyperemesis  Postop diagnosis: as above.  Procedure: Suction D&E Anesthesia General LMA and 30 cc Nesacaine paracervical block. Surgeon: Shea Evans, MD Assistant: none  IV fluids 1 lit LR Estimated blood loss  5 cc   Urine output:  straight catheter preop  100 cc clear  Complications none Condition stable Disposition PACU  Specimen: products of conception sent to pathology.  Procedure  Indication: Severe hyperemesis, no desire to continue pregnancy. Decision was made to proceed with suction D&E. Risks/ complications of surgery including infection, bleeding, damage to internal organs including uterine perforation, Asherman syndrome were reviewed. She voiced understanding and gave informed written consent.  Patient was brought to the operating room with IV running. She received preop 2 gm Ancef. She underwent general LMA anesthesia without complications. She was given dorsolithotomy position. Parts were prepped and draped in standard fashion. Bladder was catheterized once.  Bimanual exam revealed uterus to be anteverted and 8 week size.  Speculum was placed and cervix was grasped with single-tooth tenaculum. Cervical os was dilated to 24 [place] dilator. A # 8 suction curette was introduced in the uterine cavity and suction evacuation of products of conception was done in multiple step wise fashion. Gentle sharp curettage was performed until walls felt gritty. Suction cannula was reintroduced and endometrial cavity emptied. Uterine bleeding was minimal and at this point the procedure was complete.  All counts are correct x2. Specimen products of conception. No complications. Patient was made supine dorsal anesthesia and brought to the recovery room in stable condition.  Patient will be discharged home today. Follow up in 2 weeks in office. Warning signs of infection and excessive bleeding reviewed.  V.Kellan Boehlke, MD.

## 2020-05-10 NOTE — Anesthesia Postprocedure Evaluation (Signed)
Anesthesia Post Note  Patient: Cerena Efferson  Procedure(s) Performed: DILATATION AND EVACUATION (N/A Uterus)     Patient location during evaluation: PACU Anesthesia Type: General Level of consciousness: awake and alert Pain management: pain level controlled Vital Signs Assessment: post-procedure vital signs reviewed and stable Respiratory status: spontaneous breathing, nonlabored ventilation and respiratory function stable Cardiovascular status: blood pressure returned to baseline and stable Postop Assessment: no apparent nausea or vomiting Anesthetic complications: no   No complications documented.  Last Vitals:  Vitals:   05/10/20 1445 05/10/20 1500  BP: 100/73 101/73  Pulse: 65 63  Resp: 13 (!) 24  Temp:    SpO2: 99% 99%    Last Pain:  Vitals:   05/10/20 1500  TempSrc:   PainSc: 0-No pain                 Lucretia Kern

## 2020-05-11 ENCOUNTER — Encounter (HOSPITAL_BASED_OUTPATIENT_CLINIC_OR_DEPARTMENT_OTHER): Payer: Self-pay | Admitting: Obstetrics & Gynecology

## 2020-05-11 LAB — SURGICAL PATHOLOGY
